# Patient Record
Sex: Male | Born: 1955 | Race: White | Hispanic: No | Marital: Married | Smoking: Former smoker
Health system: Southern US, Community
[De-identification: ages and names within clinical notes are randomized; demographics above are authoritative.]

## PROBLEM LIST (undated history)

## (undated) DIAGNOSIS — I10 Essential (primary) hypertension: Secondary | ICD-10-CM

## (undated) DIAGNOSIS — I803 Phlebitis and thrombophlebitis of lower extremities, unspecified: Secondary | ICD-10-CM

## (undated) DIAGNOSIS — K219 Gastro-esophageal reflux disease without esophagitis: Secondary | ICD-10-CM

## (undated) DIAGNOSIS — M1990 Unspecified osteoarthritis, unspecified site: Secondary | ICD-10-CM

## (undated) DIAGNOSIS — I82409 Acute embolism and thrombosis of unspecified deep veins of unspecified lower extremity: Secondary | ICD-10-CM

## (undated) HISTORY — DX: Gastro-esophageal reflux disease without esophagitis: K21.9

## (undated) HISTORY — PX: VARICOSE VEIN SURGERY: SHX832

## (undated) HISTORY — PX: OTHER SURGICAL HISTORY: SHX169

## (undated) HISTORY — DX: Phlebitis and thrombophlebitis of lower extremities, unspecified: I80.3

## (undated) HISTORY — DX: Unspecified osteoarthritis, unspecified site: M19.90

## (undated) HISTORY — PX: TONSILLECTOMY: SUR1361

## (undated) HISTORY — DX: Essential (primary) hypertension: I10

---

## 2011-06-29 ENCOUNTER — Encounter: Payer: Self-pay | Admitting: Thoracic Surgery (Cardiothoracic Vascular Surgery)

## 2011-06-29 DIAGNOSIS — I10 Essential (primary) hypertension: Secondary | ICD-10-CM

## 2011-06-29 DIAGNOSIS — K219 Gastro-esophageal reflux disease without esophagitis: Secondary | ICD-10-CM | POA: Insufficient documentation

## 2011-06-29 DIAGNOSIS — J329 Chronic sinusitis, unspecified: Secondary | ICD-10-CM | POA: Insufficient documentation

## 2011-06-29 DIAGNOSIS — M1990 Unspecified osteoarthritis, unspecified site: Secondary | ICD-10-CM | POA: Insufficient documentation

## 2011-06-29 DIAGNOSIS — I803 Phlebitis and thrombophlebitis of lower extremities, unspecified: Secondary | ICD-10-CM | POA: Insufficient documentation

## 2011-07-04 ENCOUNTER — Encounter: Payer: Self-pay | Admitting: Thoracic Surgery (Cardiothoracic Vascular Surgery)

## 2011-07-04 ENCOUNTER — Other Ambulatory Visit: Payer: Self-pay | Admitting: Thoracic Surgery (Cardiothoracic Vascular Surgery)

## 2011-07-04 ENCOUNTER — Institutional Professional Consult (permissible substitution) (INDEPENDENT_AMBULATORY_CARE_PROVIDER_SITE_OTHER): Payer: Managed Care, Other (non HMO) | Admitting: Thoracic Surgery (Cardiothoracic Vascular Surgery)

## 2011-07-04 VITALS — BP 140/92 | HR 75 | Resp 20 | Ht 70.0 in | Wt 210.0 lb

## 2011-07-04 DIAGNOSIS — J984 Other disorders of lung: Secondary | ICD-10-CM

## 2011-07-04 DIAGNOSIS — D381 Neoplasm of uncertain behavior of trachea, bronchus and lung: Secondary | ICD-10-CM

## 2011-07-04 DIAGNOSIS — R911 Solitary pulmonary nodule: Secondary | ICD-10-CM

## 2011-07-04 NOTE — Progress Notes (Signed)
PCP is CHUKWUEMEKA,DANA, DO, DO Referring Provider is Darrel Hoover, DO  Chief Complaint  Patient presents with  . Lung Mass    Referral from Dr Irena Reichmann for surgical eval on  lung mass,  Chest CT, 06/04/2011      HPI: Brett Holland is a 54 year old gentleman who recently presented with acute pain and swelling in his left leg he was found to have bilateral popliteal DVTs. A CT of the chest was done on November for evaluate for possible pulmonary embolus. He did fact have a small clot noted and a branch vessel on the right lung. He also was found to have a 9 mm nodule along the major fissure. A PET CT was then done which showed an SUV of the nodule 2.2, although resolution was difficult due to the central nature of the lesion. There were no other hypermetabolic areas noted on the scan. He states these have a chronic cough for years that is unchanged. Denies wheezing, shortness of breath, or hemoptysis, weight loss. He has gained some weight since he stopped smoking after he was found to have a lung nodule and says his appetite is increased since then. He also has been limiting his exercise is he was instructed after the PE was noted to  Past Medical History  Diagnosis Date  . Osteoarthritis   . HTN (hypertension)   . GERD (gastroesophageal reflux disease)   . Chronic sinusitis   . Phlebitis and thrombophlebitis of lower extremities     Past Surgical History  Procedure Date  . Left knee arthroscopy x2   . Varicose vein surgery   . Left shoulder capsulitis procedure     No family history on file. Strong family history of cardiac issues. Father died of MI related to blood clot (possible PE?)  Social History History  Substance Use Topics  . Smoking status: Former Smoker    Types: Cigarettes    Quit date: 06/20/2011  . Smokeless tobacco: Never Used  . Alcohol Use: Yes     only socially    Current Outpatient Prescriptions  Medication Sig Dispense Refill  . famotidine (PEPCID)  10 MG tablet Take 10 mg by mouth 2 (two) times daily.        Marland Kitchen loratadine (CLARITIN) 10 MG tablet Take 10 mg by mouth daily.        Marland Kitchen warfarin (COUMADIN) 7.5 MG tablet Take 7.5 mg by mouth daily.         Allergies  Allergen Reactions  . Codeine Other (See Comments)    gi    Review of Systems  Constitutional: Negative.   HENT: Negative.   Eyes: Negative.   Respiratory: Positive for cough (chronic, unchanged). Negative for chest tightness, shortness of breath, wheezing and stridor.        No hemoptysis  Cardiovascular: Positive for leg swelling. Negative for chest pain and palpitations.  Gastrointestinal: Negative.   Genitourinary: Negative.   Neurological: Negative.   Hematological: Bruises/bleeds easily (on coumadin).  Psychiatric/Behavioral: Negative.   All other systems reviewed and are negative.    BP 140/92  Pulse 75  Resp 20  Ht 5\' 10"  (1.778 m)  Wt 210 lb (95.255 kg)  BMI 30.13 kg/m2  SpO2 96% Physical Exam  Vitals reviewed. Constitutional: He is oriented to person, place, and time. He appears well-developed and well-nourished. No distress.  HENT:  Head: Normocephalic and atraumatic.  Eyes: EOM are normal. Pupils are equal, round, and reactive to light.  Neck: Normal range of  motion. No tracheal deviation present. No thyromegaly present.  Cardiovascular: Normal rate, regular rhythm, normal heart sounds and intact distal pulses.  Exam reveals no gallop and no friction rub.   No murmur heard.      2+ edema LE  Pulmonary/Chest: Effort normal and breath sounds normal.  Abdominal: Soft. There is no tenderness.  Musculoskeletal: Normal range of motion.  Lymphadenopathy:    He has no cervical adenopathy.  Neurological: He is alert and oriented to person, place, and time.       nonfocal  Skin: Skin is warm and dry.     Diagnostic Tests: CT and PET CT reviewed. 9 mm nodule along the major fissure centrally, appears to be in the midportion of the right lower lobe.  No mediastinal or hilar adenopathy. Mildly hypermetabolic by PET  Impression: Brett Holland is a 35 year old smoker with a newly discovered lung nodule that is mildly hypermetabolic by PET. The PET really is equivocal, given that this lesion is so small the start with, it being mildly hypermetabolic is not particularly helpful in the diagnostic algorithm.  I reviewed the films with Brett Holland and had a long discussion with them regarding the nodule, its differential diagnosis and our options for diagnosis and treatment. We basically have 2 options: 1. Radiographic followup-the obvious downside to this approach is that if the lesion is in fact a cancer there is potential for growth and/or spread in the interim. 2. Surgical biopsy-the problem with this approach is that this lesion is very central and not amenable to a minimally invasive approach to diagnosis. It would be difficult if not impossible to wedge this lesion and even if possible would require thoracotomy to approach it. Unfortunately the lesion is not particularly amenable to bronchoscopic biopsy, even with electromagnetic navigation, given its small size and central location and lack of an adjacent bronchus.  We discussed these issues at length, Brett Holland favors an aggressive approach, but does wish to wait until after Christmas before having a major surgical procedure. As the CT was done in early November and a week followup would be in early January. Therefore we will plan to see him back right after New Year's and repeat his CT scan. And then decide whether to proceed with an open surgical biopsy, which will likely be the plan  Plan:  Return to office on 08/02/2011 with repeat CT of chest (no contrast)

## 2011-08-02 ENCOUNTER — Inpatient Hospital Stay: Admission: RE | Admit: 2011-08-02 | Payer: Managed Care, Other (non HMO) | Source: Ambulatory Visit

## 2011-08-02 ENCOUNTER — Encounter: Payer: Managed Care, Other (non HMO) | Admitting: Thoracic Surgery (Cardiothoracic Vascular Surgery)

## 2011-08-03 ENCOUNTER — Encounter: Payer: Self-pay | Admitting: Thoracic Surgery (Cardiothoracic Vascular Surgery)

## 2011-08-03 ENCOUNTER — Ambulatory Visit (INDEPENDENT_AMBULATORY_CARE_PROVIDER_SITE_OTHER): Payer: Managed Care, Other (non HMO) | Admitting: Thoracic Surgery (Cardiothoracic Vascular Surgery)

## 2011-08-03 ENCOUNTER — Ambulatory Visit
Admission: RE | Admit: 2011-08-03 | Discharge: 2011-08-03 | Disposition: A | Discharge status: Home / Self Care | Payer: Managed Care, Other (non HMO) | Source: Ambulatory Visit | Attending: Thoracic Surgery (Cardiothoracic Vascular Surgery) | Admitting: Thoracic Surgery (Cardiothoracic Vascular Surgery)

## 2011-08-03 VITALS — BP 136/88 | HR 79 | Resp 16 | Ht 70.0 in | Wt 210.0 lb

## 2011-08-03 DIAGNOSIS — D381 Neoplasm of uncertain behavior of trachea, bronchus and lung: Secondary | ICD-10-CM

## 2011-08-03 DIAGNOSIS — R918 Other nonspecific abnormal finding of lung field: Secondary | ICD-10-CM

## 2011-08-03 NOTE — Progress Notes (Signed)
HPI 56 year old who was seen about a month ago for a right lung nodule. He had developed pain and swelling in his leg in early November and was found to have a DVT. He had a CT of the chest which showed a small pulmonary embolus and also several lung nodules. There is a calcified granuloma in the left lung that appeared benign. There was a 9 mm nodule which on CT was felt to be in the right upper lobe, but on PET was felt to be in the superior segment right lower lobe, in any event was on the major fissure. There also couple of small nodules in the left lung. A PET CT had been done which showed the right lung lesion to be mildly hypermetabolic with an SUV of 2.2. He did not want to have anything done prior to the holidays, so we scheduled a repeat CT and followup visit for today since his been about 2 months from his initial CT scan.  In the interim he says is felt well and has no complaints speak of. He remains on Coumadin and has not had any difficulties related to that  Current Outpatient Prescriptions  Medication Sig Dispense Refill  . amoxicillin (AMOXIL) 875 MG tablet Take 875 mg by mouth 2 (two) times daily.        . famotidine (PEPCID) 10 MG tablet Take 10 mg by mouth 2 (two) times daily.        Marland Kitchen loratadine (CLARITIN) 10 MG tablet Take 10 mg by mouth daily.        Marland Kitchen warfarin (COUMADIN) 7.5 MG tablet Take 7.5 mg by mouth daily.        Allergy Codeine  Review of Systems: See history of present illness, all other systems negative  Physical Exam  Vitals reviewed. Constitutional: He is oriented to person, place, and time. He appears well-developed and well-nourished. No distress.  HENT:  Head: Normocephalic and atraumatic.  Neck: No thyromegaly present.  Cardiovascular: Normal rate.        Slightly irregular  Pulmonary/Chest: Effort normal and breath sounds normal. He has no wheezes. He has no rales.  Lymphadenopathy:    He has no cervical adenopathy.  Neurological: He is alert and  oriented to person, place, and time.  Skin: Skin is warm and dry.  Psychiatric: He has a normal mood and affect.     Diagnostic Tests: CT of the chest was performed, apparently radiologist could not compared to the film from [place] as it is in the system under a separate medical record number, but I did compare the films. There has been no significant interval change, the right lung nodule was actually measured this time it 8 mm versus 9.5 previously, however this is within the marginal of error.  Impression: Mr. Vogelsang is a 56 year old gentleman with a history of tobacco use who has a central right lung nodule measuring about 8-9 mm in diameter, this is mildly hypermetabolic on PET. Unfortunately given the location of this nodule it would be very difficult to approach bronchoscopically even with the electromagnetic guidance. Certainly not approachable by percutaneous standpoint. I discussed with the patient and his wife the findings of the CTs, actually reviewed the films themselves with the patient and his wife  showing them the nodules and comparing between the 2 scans.  My recommendation to Mr. Shappell at this point given his been no change in 2 months and also taking into consideration the difficulty with accessing the nodule as well as  his recent DVT and PE was to repeat the scan in about 3 months. Given that this nodules not grown at all in 2 months is possible that this does not represent a lung cancer, and certainly if it was an aggressive cancer U. expectancy is size change in 2 months. His perioperative risk would be elevated given his recent DVT and PE think with additional time his risk would be reduced greatly for perioperative morbidity and mortality.  Plan: I'll plan to see Mr. Uy back in 3 months with a repeat CT of the chest to reevaluate the lung nodules and further discuss potential surgery versus continued radiologic observation

## 2011-10-03 ENCOUNTER — Other Ambulatory Visit: Payer: Self-pay | Admitting: Surgery

## 2011-10-03 DIAGNOSIS — D381 Neoplasm of uncertain behavior of trachea, bronchus and lung: Secondary | ICD-10-CM

## 2011-10-25 ENCOUNTER — Ambulatory Visit (INDEPENDENT_AMBULATORY_CARE_PROVIDER_SITE_OTHER): Payer: Managed Care, Other (non HMO) | Admitting: Thoracic Surgery (Cardiothoracic Vascular Surgery)

## 2011-10-25 ENCOUNTER — Ambulatory Visit
Admission: RE | Admit: 2011-10-25 | Discharge: 2011-10-25 | Disposition: A | Discharge status: Home / Self Care | Payer: Managed Care, Other (non HMO) | Source: Ambulatory Visit | Attending: Surgery | Admitting: Surgery

## 2011-10-25 ENCOUNTER — Encounter: Payer: Self-pay | Admitting: Thoracic Surgery (Cardiothoracic Vascular Surgery)

## 2011-10-25 VITALS — BP 146/96 | HR 85 | Resp 16 | Ht 70.0 in | Wt 213.0 lb

## 2011-10-25 DIAGNOSIS — D381 Neoplasm of uncertain behavior of trachea, bronchus and lung: Secondary | ICD-10-CM

## 2011-10-25 DIAGNOSIS — R911 Solitary pulmonary nodule: Secondary | ICD-10-CM

## 2011-10-25 DIAGNOSIS — Z09 Encounter for follow-up examination after completed treatment for conditions other than malignant neoplasm: Secondary | ICD-10-CM

## 2011-10-25 NOTE — Progress Notes (Signed)
HPI: Brett Holland 56 year old gentleman who we've been following since November with multiple lung nodules, the largest being an 8 mm nodule in the right lower lobe near the hilum. These were initially found that he had a CT of the chest, which diagnosed him with a pulmonary embolus He was last in the office in January. At that time the nodule had not changed in size. He now returns for 3 month followup. He says his been feeling well in the interim. He has not had any palpitations or difficulties with his Coumadin. He has no respiratory complaints.  Past Medical History  Diagnosis Date  . Osteoarthritis   . HTN (hypertension)   . GERD (gastroesophageal reflux disease)   . Chronic sinusitis   . Phlebitis and thrombophlebitis of lower extremities      Current Outpatient Prescriptions  Medication Sig Dispense Refill  . famotidine (PEPCID) 10 MG tablet Take 10 mg by mouth 2 (two) times daily.        Marland Kitchen loratadine (CLARITIN) 10 MG tablet Take 10 mg by mouth daily.        Marland Kitchen warfarin (COUMADIN) 7.5 MG tablet Take 7.5 mg by mouth daily.          Physical Exam: BP 146/96  Pulse 85  Resp 16  Ht 5\' 10"  (1.778 m)  Wt 213 lb (96.616 kg)  BMI 30.56 kg/m2  SpO2 97% General well-developed well-nourished in no acute distress Lungs clear with equal breath sounds bilaterally No palpable cervical or suprapubic or adenopathy  Diagnostic Tests: CT of the chest 10/25/11  IMPRESSION:  1. The majority of pulmonary nodules are similar. Tiny nodules  appear new and are favored to represent subpleural lymph nodes.  Recommend follow-up at 6 months to confirm ongoing stability. This  recommendation follows the consensus statement: Guidelines for  Management of Small Pulmonary Nodules Detected on CT Scans: A  Statement from the Fleischner Society as published in Radiology  2005; 237:395-400. 2. No acute process in the chest.  3. Vague hypoattenuation in the left lobe of the liver. Favored  to represent  branching vessel. Recommend attention on follow-up.  Impression: No change in pulmonary nodules in the 3 month interim since his last study. I reviewed the CT scan with Mr. and Mrs. Waitman. I recommended to him that we continue to follow these nodules. I recommended that we repeat a scan in 3 months.   Plan: Return to the office in 3 months with CT of the chest

## 2011-11-20 ENCOUNTER — Ambulatory Visit: Payer: Managed Care, Other (non HMO) | Admitting: Surgery

## 2011-11-20 ENCOUNTER — Other Ambulatory Visit: Payer: Managed Care, Other (non HMO)

## 2011-12-13 ENCOUNTER — Other Ambulatory Visit: Payer: Self-pay | Admitting: Thoracic Surgery (Cardiothoracic Vascular Surgery)

## 2011-12-13 DIAGNOSIS — D381 Neoplasm of uncertain behavior of trachea, bronchus and lung: Secondary | ICD-10-CM

## 2012-01-22 ENCOUNTER — Ambulatory Visit: Payer: Managed Care, Other (non HMO) | Admitting: Thoracic Surgery (Cardiothoracic Vascular Surgery)

## 2012-01-22 ENCOUNTER — Other Ambulatory Visit: Payer: Managed Care, Other (non HMO)

## 2012-01-29 ENCOUNTER — Ambulatory Visit: Payer: Managed Care, Other (non HMO) | Admitting: Thoracic Surgery (Cardiothoracic Vascular Surgery)

## 2012-01-29 ENCOUNTER — Other Ambulatory Visit: Payer: Managed Care, Other (non HMO)

## 2012-02-05 ENCOUNTER — Ambulatory Visit
Admission: RE | Admit: 2012-02-05 | Discharge: 2012-02-05 | Disposition: A | Discharge status: Home / Self Care | Payer: Managed Care, Other (non HMO) | Source: Ambulatory Visit | Attending: Thoracic Surgery (Cardiothoracic Vascular Surgery) | Admitting: Thoracic Surgery (Cardiothoracic Vascular Surgery)

## 2012-02-05 ENCOUNTER — Ambulatory Visit (INDEPENDENT_AMBULATORY_CARE_PROVIDER_SITE_OTHER): Payer: Managed Care, Other (non HMO) | Admitting: Thoracic Surgery (Cardiothoracic Vascular Surgery)

## 2012-02-05 ENCOUNTER — Encounter: Payer: Self-pay | Admitting: Thoracic Surgery (Cardiothoracic Vascular Surgery)

## 2012-02-05 VITALS — BP 137/93 | HR 89 | Resp 20 | Ht 70.0 in | Wt 213.0 lb

## 2012-02-05 DIAGNOSIS — D381 Neoplasm of uncertain behavior of trachea, bronchus and lung: Secondary | ICD-10-CM

## 2012-02-05 DIAGNOSIS — R918 Other nonspecific abnormal finding of lung field: Secondary | ICD-10-CM

## 2012-02-05 NOTE — Progress Notes (Signed)
HPI 56 yo returns for scheduled follow up for pulmonary nodules.  He was found to have multiple pulmonary nodules in January of this year on CT scan. I recommended observation and a repeat CT scan in 3 months there was no change in the nodules. He now returns for a 6 month followup. He says his been feeling well in the meantime with no issues.  Past Medical History  Diagnosis Date  . Osteoarthritis   . HTN (hypertension)   . GERD (gastroesophageal reflux disease)   . Chronic sinusitis   . Phlebitis and thrombophlebitis of lower extremities      Current Outpatient Prescriptions  Medication Sig Dispense Refill  . amoxicillin-clavulanate (AUGMENTIN) 875-125 MG per tablet Take 1 tablet by mouth 2 (two) times daily.       . famotidine (PEPCID) 10 MG tablet Take 10 mg by mouth 2 (two) times daily.        Marland Kitchen loratadine (CLARITIN) 10 MG tablet Take 10 mg by mouth daily.        Marland Kitchen warfarin (COUMADIN) 7.5 MG tablet Take 7.5 mg by mouth daily.          Physical Exam BP 137/93  Pulse 89  Resp 20  Ht 5\' 10"  (1.778 m)  Wt 213 lb (96.616 kg)  BMI 30.56 kg/m2  SpO2 95% Well-appearing 56 year old male in no acute distress  Diagnostic Tests:  CT chest shows no change in multiple pulmonary nodules   Impression: 56 year old white male with multiple pulmonary nodules. These have been stable on CT for 6 months. Continue to be followed every 3 months out to 2 years to assure stability.  Plan: Return in 3 months with CT of the chest

## 2012-04-11 ENCOUNTER — Other Ambulatory Visit: Payer: Self-pay | Admitting: Thoracic Surgery (Cardiothoracic Vascular Surgery)

## 2012-04-11 DIAGNOSIS — D381 Neoplasm of uncertain behavior of trachea, bronchus and lung: Secondary | ICD-10-CM

## 2012-05-13 ENCOUNTER — Encounter: Payer: Self-pay | Admitting: Thoracic Surgery (Cardiothoracic Vascular Surgery)

## 2012-05-13 ENCOUNTER — Ambulatory Visit
Admission: RE | Admit: 2012-05-13 | Discharge: 2012-05-13 | Disposition: A | Discharge status: Home / Self Care | Payer: Managed Care, Other (non HMO) | Source: Ambulatory Visit | Attending: Thoracic Surgery (Cardiothoracic Vascular Surgery) | Admitting: Thoracic Surgery (Cardiothoracic Vascular Surgery)

## 2012-05-13 ENCOUNTER — Ambulatory Visit (INDEPENDENT_AMBULATORY_CARE_PROVIDER_SITE_OTHER): Payer: Managed Care, Other (non HMO) | Admitting: Thoracic Surgery (Cardiothoracic Vascular Surgery)

## 2012-05-13 VITALS — BP 160/96 | HR 58 | Resp 18 | Ht 70.0 in | Wt 213.0 lb

## 2012-05-13 DIAGNOSIS — R918 Other nonspecific abnormal finding of lung field: Secondary | ICD-10-CM

## 2012-05-13 DIAGNOSIS — D381 Neoplasm of uncertain behavior of trachea, bronchus and lung: Secondary | ICD-10-CM

## 2012-05-13 NOTE — Progress Notes (Signed)
  HPI:  [name] returns today for a 3 month followup of multiple lung nodules. These nodules were first noted on CT angiogram in November of 2012 which was done to evaluate for pulmonary embolus. He did in fact have a pulmonary embolus. He also is noted to have several lung nodules the largest of which was in the right lung measured 9.5 mm. And elected to follow him at that time as he had just been diagnosed with acute PE. He has had CT scans every 3 months since then, which have shown no change in the size of the nodules.  Since his last visit he's been doing well with the exception of pain in his left knee from chronic osteoarthritis. He denies any chest pain or shortness of breath. His weight has been stable.   Past Medical History  Diagnosis Date  . Osteoarthritis   . HTN (hypertension)   . GERD (gastroesophageal reflux disease)   . Chronic sinusitis   . Phlebitis and thrombophlebitis of lower extremities   pulmonary embolus  Current Outpatient Prescriptions  Medication Sig Dispense Refill  . famotidine (PEPCID) 10 MG tablet Take 10 mg by mouth 2 (two) times daily.        Marland Kitchen loratadine (CLARITIN) 10 MG tablet Take 10 mg by mouth daily.        Marland Kitchen warfarin (COUMADIN) 7.5 MG tablet Take 7.5 mg by mouth daily.         Physical Exam BP 160/96  Pulse 58  Resp 18  Ht 5\' 10"  (1.778 m)  Wt 213 lb (96.616 kg)  BMI 30.56 kg/m2  SpO2 97% General well-developed well-nourished 56 year old male in no acute distress Neurologically alert and oriented x3 with no focal deficits Neck supple with no adenopathy Lungs clear with equal breath sounds bilaterally Cardiac regular rate and rhythm normal S1 and S2  Diagnostic Tests: CT of chest 05/13/2012 *RADIOLOGY REPORT*  Clinical Data: Follow-up lung nodule.  CT CHEST WITHOUT CONTRAST  Technique: Multidetector CT imaging of the chest was performed  following the standard protocol without IV contrast.  Comparison: 02/15/2012  Findings:  Perifissural nodule in the right lower lobe measures 8 mm  on image 28, stable. Calcified left upper lobe granuloma, stable.  5 mm nodule in the lingula on image 31, stable. The central left  upper lobe nodule on image 27 is stable. No new or enlarging  pulmonary nodules. No pleural effusions. Mild emphysematous  changes.  Heart is normal size. Aorta is normal caliber. No mediastinal,  hilar, or axillary adenopathy. Visualized thyroid and chest wall  soft tissues unremarkable. Imaging into the upper abdomen shows no  acute findings.  No acute bony abnormality.  IMPRESSION:  Stable small scattered pulmonary nodules. No new or enlarging  nodules. These nodules are stable dating back to November 2012 and  should be followed for stability over a 2-year period.  Impression: 56 year old male with multiple lung nodules. These have now been stable for a year. He needs to continue to be followed up to 2 years to ensure stability of the nodules. We'll plan to see him back in 4 months with a CT of the chest at that time.  Plan: Return in 4 months with CT of chest

## 2012-08-04 ENCOUNTER — Other Ambulatory Visit: Payer: Self-pay | Admitting: Thoracic Surgery (Cardiothoracic Vascular Surgery)

## 2012-08-04 DIAGNOSIS — D381 Neoplasm of uncertain behavior of trachea, bronchus and lung: Secondary | ICD-10-CM

## 2012-09-16 ENCOUNTER — Ambulatory Visit (INDEPENDENT_AMBULATORY_CARE_PROVIDER_SITE_OTHER): Payer: Managed Care, Other (non HMO) | Admitting: Thoracic Surgery (Cardiothoracic Vascular Surgery)

## 2012-09-16 ENCOUNTER — Ambulatory Visit
Admission: RE | Admit: 2012-09-16 | Discharge: 2012-09-16 | Disposition: A | Discharge status: Home / Self Care | Payer: Managed Care, Other (non HMO) | Source: Ambulatory Visit | Attending: Thoracic Surgery (Cardiothoracic Vascular Surgery) | Admitting: Thoracic Surgery (Cardiothoracic Vascular Surgery)

## 2012-09-16 ENCOUNTER — Encounter: Payer: Self-pay | Admitting: Thoracic Surgery (Cardiothoracic Vascular Surgery)

## 2012-09-16 VITALS — BP 137/91 | HR 67 | Resp 16 | Ht 70.0 in | Wt 200.0 lb

## 2012-09-16 DIAGNOSIS — D381 Neoplasm of uncertain behavior of trachea, bronchus and lung: Secondary | ICD-10-CM

## 2012-09-16 DIAGNOSIS — R918 Other nonspecific abnormal finding of lung field: Secondary | ICD-10-CM

## 2012-09-16 NOTE — Progress Notes (Signed)
HPI:  Brett Holland returns today for followup of a right lower lobe nodule. He is a 57 year old man who was being evaluated for a pulmonary embolus with a CT of the chest back in the fall of 2012. A small nodules noted in the right lower lobe. He also had some calcified granulomas. We elected to follow this nodule and it has remained stable over time. He was last in the office 4 months ago at which time the lesion was stable and he was doing well. He is a former smoker but he quit when this was found.  In the interim since his last visit Mr. Carstens says that he has been doing well. He's not had any cough or hemoptysis. His weight has been stable. He's not had any problems with shortness of breath or wheezing. He was taken off of Coumadin after one year.  Past Medical History  Diagnosis Date  . Osteoarthritis   . HTN (hypertension)   . GERD (gastroesophageal reflux disease)   . Chronic sinusitis   . Phlebitis and thrombophlebitis of lower extremities       Current Outpatient Prescriptions  Medication Sig Dispense Refill  . famotidine (PEPCID) 10 MG tablet Take 10 mg by mouth 2 (two) times daily.        Marland Kitchen loratadine (CLARITIN) 10 MG tablet Take 10 mg by mouth daily.         No current facility-administered medications for this visit.    Physical Exam BP 137/91  Pulse 67  Resp 16  Ht 5\' 10"  (1.778 m)  Wt 200 lb (90.719 kg)  BMI 28.7 kg/m2  SpO2 97% General 57 year old male in no acute distress No cervical or suprapubic or adenopathy Lungs clear with equal breath sounds bilaterally Cardiac regular rate and rhythm normal S1 and S2  Diagnostic Tests: CT chest 09/16/12 CT CHEST WITHOUT CONTRAST  Technique: Multidetector CT imaging of the chest was performed  following the standard protocol without IV contrast.  Comparison: CT chest of 05/13/2012 ,08/03/2011 and 06/04/2011  Findings: The nodule near the fissure within the right lower lobe  is stable measuring 8 mm in diameter,  unchanged compared to the CT  from 05/2011. In addition small calcified granulomas within the  left upper lobe are stable as is the vague nodular density within  the lingula. No new or enlarging nodule is seen. The stability of  these nodules over almost 2 years is indicative of a benign  process. No additional follow-up CT chest is necessary in view of  the stability. No infiltrate or effusion is seen. The central  airway is patent. There are degenerative changes in the mid to  lower thoracic spine.  On soft tissue window images, the thyroid gland is stable. On this  unenhanced study, no mediastinal or hilar adenopathy is seen.  Calcified left hilar nodes are present from prior granulomatous  disease. No abnormality within the upper abdomen is seen.  IMPRESSION:  1. Calcified granulomas and calcified left hilar nodes are stable  consistent with prior granulomatous disease.  2. The 8 mm non-calcified perifissural node in the right lower  lobe is stable. No enlarging nodule is seen.   Impression: 57 year old gentleman with a right lower lobe nodule that is unchanged for a little over a year now. This needs to be followed out to 2 years, which will be next fall. We'll plan to continue to follow him at four-month intervals.   Plan: Return in 4 months with CT of chest  .

## 2012-12-19 ENCOUNTER — Other Ambulatory Visit: Payer: Self-pay | Admitting: *Deleted

## 2012-12-19 DIAGNOSIS — R911 Solitary pulmonary nodule: Secondary | ICD-10-CM

## 2013-01-05 ENCOUNTER — Other Ambulatory Visit: Payer: Self-pay | Admitting: *Deleted

## 2013-01-06 ENCOUNTER — Encounter: Payer: Self-pay | Admitting: Thoracic Surgery (Cardiothoracic Vascular Surgery)

## 2013-01-06 ENCOUNTER — Ambulatory Visit
Admission: RE | Admit: 2013-01-06 | Discharge: 2013-01-06 | Disposition: A | Discharge status: Home / Self Care | Payer: Managed Care, Other (non HMO) | Source: Ambulatory Visit | Attending: Thoracic Surgery (Cardiothoracic Vascular Surgery) | Admitting: Thoracic Surgery (Cardiothoracic Vascular Surgery)

## 2013-01-06 ENCOUNTER — Ambulatory Visit (INDEPENDENT_AMBULATORY_CARE_PROVIDER_SITE_OTHER): Payer: Managed Care, Other (non HMO) | Admitting: Thoracic Surgery (Cardiothoracic Vascular Surgery)

## 2013-01-06 VITALS — BP 125/80 | HR 80 | Resp 19 | Ht 70.0 in | Wt 195.0 lb

## 2013-01-06 DIAGNOSIS — R911 Solitary pulmonary nodule: Secondary | ICD-10-CM

## 2013-01-06 NOTE — Progress Notes (Signed)
  HPI:  Brett Holland returns today to schedule followup visit. He is a 57 year old gentleman who I been following since December of 2012 for multiple lung nodules. In October 2012 he had had a scan of his legs which showed bilateral popliteal DVTs. A CT of the chest was done to rule out pulmonary embolus. There was no PE but he was noted to have multiple bilateral lung nodules. He did have some calcified granulomas. A PET CT showed the lesion in the right lower lobe to be mildly hypermetabolic. Given his recent DVT and risk of surgery we elected to follow it at that time. We have been following him with regular CTs ever since then and nodules have remained unchanged. He was last seen in February which time he was doing well.  He says that in the interim since his last visit he's continued to do well. He is exercising regularly and has lost some weight. He quit smoking in October of 2012 and he was first diagnosed with blood clots. He has not smoked since then. Overall he feels well.  Past Medical History  Diagnosis Date  . Osteoarthritis   . HTN (hypertension)   . GERD (gastroesophageal reflux disease)   . Chronic sinusitis   . Phlebitis and thrombophlebitis of lower extremities       Current Outpatient Prescriptions  Medication Sig Dispense Refill  . famotidine (PEPCID) 10 MG tablet Take 10 mg by mouth 2 (two) times daily.        . fluticasone (FLONASE) 50 MCG/ACT nasal spray       . loratadine (CLARITIN) 10 MG tablet Take 10 mg by mouth daily.         No current facility-administered medications for this visit.    Physical Exam BP 125/80  Pulse 80  Resp 19  Ht 5\' 10"  (1.778 m)  Wt 195 lb (88.451 kg)  BMI 27.98 kg/m2  SpO2 98% Well-appearing 58 year old male in no acute distress Alert and oriented x3 Cardiac regular rate and rhythm normal S1 and S2 No cervical or subclavicular adenopathy Lungs clear with equal breath sounds bilaterally  Diagnostic Tests: CT of chest  01/06/2013 *RADIOLOGY REPORT*  Clinical Data: 70-month follow up of lung nodule. Ex-smoker.  CT CHEST WITHOUT CONTRAST  Technique: Multidetector CT imaging of the chest was performed  following the standard protocol without IV contrast.  Comparison: 09/16/2012 and 05/13/2012  Findings: Lungs/pleura: Mild centrilobular emphysema.  Similar perifissural right lower lobe lung nodule which measures 8  mm on image 27/series 4.  Calcified left upper lobe granuloma.  Left upper lobe 4 mm subpleural nodule on image 29/series 4 is  unchanged. No pleural fluid.  Heart/Mediastinum: Tortuous thoracic aorta. Borderline  cardiomegaly, without pericardial effusion. Calcified left hilar  lymph node, consistent with old granulomatous disease.  Upper abdomen: Mild right adrenal thickening which is unchanged.  Bones/Musculoskeletal: No acute osseous abnormality. Prominent  end plate osteophytes, including at T10-T11.  IMPRESSION:  1. Similar appearance of 8 mm right lower lobe lung nodule. No  new or enlarging nodules identified.  2. No acute process in the chest.  Original Report Authenticated By: Jeronimo Greaves, M.D.   Impression: 57 year old former smoker with multiple lung nodules first noted in October 2012. These have remained unchanged on multiple CT scans. He needs another CT in 4 months to complete his 2 year followup.   Plan: Return in 4 months with CT of chest

## 2013-02-26 ENCOUNTER — Encounter: Payer: Self-pay | Admitting: Cardiovascular Disease

## 2013-02-26 ENCOUNTER — Ambulatory Visit (INDEPENDENT_AMBULATORY_CARE_PROVIDER_SITE_OTHER): Payer: Managed Care, Other (non HMO) | Admitting: Cardiovascular Disease

## 2013-02-26 VITALS — BP 115/80 | HR 65 | Wt 187.0 lb

## 2013-02-26 DIAGNOSIS — R9439 Abnormal result of other cardiovascular function study: Secondary | ICD-10-CM

## 2013-02-26 DIAGNOSIS — I803 Phlebitis and thrombophlebitis of lower extremities, unspecified: Secondary | ICD-10-CM

## 2013-02-26 DIAGNOSIS — I10 Essential (primary) hypertension: Secondary | ICD-10-CM

## 2013-02-26 DIAGNOSIS — R079 Chest pain, unspecified: Secondary | ICD-10-CM

## 2013-02-26 NOTE — Assessment & Plan Note (Signed)
F/U Dr Jimmie Molly Continue to wear support hose

## 2013-02-26 NOTE — Progress Notes (Signed)
Patient ID: Brett Holland, male   DOB: Jul 16, 1956, 57 y.o.   MRN: [id number] 57 yo referred from [facility].  Episode of chest pain at work about 3 weeks ago.  Mild diaphoresis.  Had f/u myovue.  I reviewed the report and actual images which patient brought on disc.  Suboptimal study with "hot spot" on anterior wall.  Study looked normal to me especially on gray scale imaging  Stress test portion also normal with HTN response Read as anterior ischemia.  Patient has not had recurrent pain. Very concerned about his heart due to family history of premature CAD.  Also has family history of hypercoagulable state and he was on coumadin at one time but testing showed no issues and he is not anticoagulated at this time Has GERD on Rx but this has not caused chest pain.  Chronic LE vein disease sees Dr Jimmie Molly for   ROS: Denies fever, malais, weight loss, blurry vision, decreased visual acuity, cough, sputum, SOB, hemoptysis, pleuritic pain, palpitaitons, heartburn, abdominal pain, melena, lower extremity edema, claudication, or rash.  All other systems reviewed and negative   General: Affect appropriate Healthy:  appears stated age HEENT: normal Neck supple with no adenopathy JVP normal no bruits no thyromegaly Lungs clear with no wheezing and good diaphragmatic motion Heart:  S1/S2 no murmur,rub, gallop or click PMI normal Abdomen: benighn, BS positve, no tenderness, no AAA no bruit.  No HSM or HJR Distal pulses intact with no bruits Trace bilateral  Edema  With varicosities Neuro non-focal Skin warm and dry No muscular weakness  Medications Current Outpatient Prescriptions  Medication Sig Dispense Refill  . famotidine (PEPCID) 10 MG tablet Take 10 mg by mouth 2 (two) times daily.        . fluticasone (FLONASE) 50 MCG/ACT nasal spray Place 1 spray into the nose daily.       Marland Kitchen loratadine (CLARITIN) 10 MG tablet Take 10 mg by mouth daily.         No current facility-administered medications  for this visit.    Allergies Codeine  Family History: No family history on file.  Social History: History   Social History  . Marital Status: Married    Spouse Name: N/A    Number of Children: N/A  . Years of Education: N/A   Occupational History  . Not on file.   Social History Main Topics  . Smoking status: Former Smoker    Types: Cigarettes    Quit date: 06/20/2011  . Smokeless tobacco: Never Used  . Alcohol Use: Yes     Comment: only socially  . Drug Use: No  . Sexually Active: Not on file   Other Topics Concern  . Not on file   Social History Narrative  . No narrative on file    Electrocardiogram:  NSR rate 65 low voltage otherwise normal  Assessment and Plan

## 2013-02-26 NOTE — Assessment & Plan Note (Signed)
Single sever episode with f/u myovue read as ? Anterior ischemia.  Review to me looks like normal/low risk scan.  Reviewed his CT scan from April and he has no coronary calcium  Gave him the option of doing nothing and waiting for more symptoms or having cardiac CT.  He needs to know if he has any CAD and is aggravated about positive myovue.  Discussed cardiac CT and he is willing to have it.  Samples of bystolic given 75m to take night before and morning of.  Anatomic study would resolve issue of abnormal physiologic test and detect any disease that would need risk factor modification in lieu of family history and not just detect severe flow limiting disease

## 2013-02-26 NOTE — Patient Instructions (Addendum)
Your physician recommends that you schedule a follow-up appointment in: PENDING CT RESULTS Your physician recommends that you continue on your current medications as directed. Please refer to the Current Medication list given to you today.  Your physician has requested that you have cardiac CT. Cardiac computed tomography (CT) is a painless test that uses an x-ray machine to take clear, detailed pictures of your heart. For further information please visit [email or web address] Please follow instruction sheet as given.  TAKE SAMPLE   MED   NIGHT BEFORE AND AM  OF  TEST

## 2013-03-03 ENCOUNTER — Encounter (HOSPITAL_COMMUNITY): Payer: Self-pay

## 2013-03-03 ENCOUNTER — Encounter: Payer: Self-pay | Admitting: Cardiology

## 2013-03-03 ENCOUNTER — Ambulatory Visit (HOSPITAL_COMMUNITY)
Admission: RE | Admit: 2013-03-03 | Discharge: 2013-03-03 | Disposition: A | Discharge status: Home / Self Care | Payer: Managed Care, Other (non HMO) | Source: Ambulatory Visit | Attending: Cardiovascular Disease | Admitting: Cardiovascular Disease

## 2013-03-03 DIAGNOSIS — R9439 Abnormal result of other cardiovascular function study: Secondary | ICD-10-CM

## 2013-03-03 DIAGNOSIS — R079 Chest pain, unspecified: Secondary | ICD-10-CM | POA: Insufficient documentation

## 2013-03-03 MED ORDER — NITROGLYCERIN 0.4 MG SL SUBL: 0.4000 mg | SUBLINGUAL_TABLET | SUBLINGUAL | Status: DC | PRN

## 2013-03-03 MED ORDER — IOHEXOL 350 MG/ML SOLN
100.0000 mL | Freq: Once | INTRAVENOUS | Status: AC | PRN
  Administered 2013-03-03: 100 mL via INTRAVENOUS

## 2013-03-03 MED ORDER — NITROGLYCERIN 0.4 MG SL SUBL
SUBLINGUAL_TABLET | SUBLINGUAL | Status: AC
  Administered 2013-03-03: 0.4 mg
  Filled 2013-03-03: qty 25

## 2013-03-03 MED ORDER — METOPROLOL TARTRATE 1 MG/ML IV SOLN
INTRAVENOUS | Status: AC
  Filled 2013-03-03: qty 5

## 2013-04-29 ENCOUNTER — Other Ambulatory Visit: Payer: Self-pay

## 2013-04-29 DIAGNOSIS — D381 Neoplasm of uncertain behavior of trachea, bronchus and lung: Secondary | ICD-10-CM

## 2013-05-26 ENCOUNTER — Ambulatory Visit
Admission: RE | Admit: 2013-05-26 | Discharge: 2013-05-26 | Disposition: A | Discharge status: Home / Self Care | Payer: Managed Care, Other (non HMO) | Source: Ambulatory Visit | Attending: Thoracic Surgery (Cardiothoracic Vascular Surgery) | Admitting: Thoracic Surgery (Cardiothoracic Vascular Surgery)

## 2013-05-26 ENCOUNTER — Ambulatory Visit (INDEPENDENT_AMBULATORY_CARE_PROVIDER_SITE_OTHER): Payer: Managed Care, Other (non HMO) | Admitting: Thoracic Surgery (Cardiothoracic Vascular Surgery)

## 2013-05-26 VITALS — BP 155/94 | HR 73 | Resp 16 | Ht 70.0 in | Wt 185.0 lb

## 2013-05-26 DIAGNOSIS — R918 Other nonspecific abnormal finding of lung field: Secondary | ICD-10-CM

## 2013-05-26 DIAGNOSIS — D381 Neoplasm of uncertain behavior of trachea, bronchus and lung: Secondary | ICD-10-CM

## 2013-05-26 NOTE — Progress Notes (Signed)
HPI:  Mr. Fearnow returns today for a 4 month followup visit. He is a 57 year old gentleman who had a pulmonary embolus back in 2012. That was diagnosed on the CT angiogram chest. He also was found to have an 8 mm right lung nodule. Given that he had a recent PE, we made the decision not to operate but to follow this radiographically.  Since his last visit he lost about 40 pounds. He attributes this primarily to diet and eliminating carbohydrates. He's been feeling well. He denies cough, hemoptysis. He did have an episode back in July where he had some chest pain. He was worked up and found not to have any coronary disease. He did quit smoking back in 2012 and he was diagnosed with pulmonary emboli.  Past Medical History  Diagnosis Date  . Osteoarthritis   . HTN (hypertension)   . GERD (gastroesophageal reflux disease)   . Chronic sinusitis   . Phlebitis and thrombophlebitis of lower extremities       Current Outpatient Prescriptions  Medication Sig Dispense Refill  . famotidine (PEPCID) 10 MG tablet Take 10 mg by mouth 2 (two) times daily.        . fluticasone (FLONASE) 50 MCG/ACT nasal spray Place 1 spray into the nose daily.       Marland Kitchen loratadine (CLARITIN) 10 MG tablet Take 10 mg by mouth daily.        Marland Kitchen amoxicillin-clavulanate (AUGMENTIN) 875-125 MG per tablet        No current facility-administered medications for this visit.    Physical Exam BP 155/94  Pulse 73  Resp 16  Ht 5\' 10"  (1.778 m)  Wt 185 lb (83.915 kg)  BMI 26.54 kg/m2  SpO70 34% 57 year old male in no acute distress General well-developed well-nourished Neurologic alert and oriented x3 with no focal deficits Cardiac regular in rhythm normal S1 and S2 Lungs clear with equal breath sounds bilaterally  Diagnostic Tests: CT of chest 05/26/2013 CLINICAL DATA: Right lower lobe pulmonary nodule diagnosed 2 years  ago. Prior history of smoking. Followup study.  EXAM:  CT CHEST WITHOUT CONTRAST  TECHNIQUE:   Multidetector CT imaging of the chest was performed following the  standard protocol without IV contrast.  COMPARISON: Chest CT 01/06/2013.  FINDINGS:  Mediastinum: Heart size is normal. There is no significant  pericardial fluid, thickening or pericardial calcification. No  pathologically enlarged mediastinal or hilar lymph nodes. Esophagus  is unremarkable in appearance. Calcified left hilar lymph nodes.  Lungs/Pleura: Again noted is an 8 mm nodule in the perihilar aspect  of the right upper lobe abutting the major fissure (image 34 of  series 4), which appears unchanged in retrospect compared to  original examination from 06/04/2011. Calcified granuloma in the  anterior aspect of the left upper lobe is unchanged. No other  suspicious appearing pulmonary nodules or masses are otherwise  identified. No acute consolidative airspace disease. No pleural  effusions. Small amount of subsegmental atelectasis or scarring in  the medial segment of the right middle lobe and posterior aspect of  the left lower lobe. Small bulla in the perihilar severe of the left  upper lobe. Mild paraseptal emphysema, most pronounced in the lung  apices.  Upper Abdomen: Unremarkable.  Musculoskeletal: There are no aggressive appearing lytic or blastic  lesions noted in the visualized portions of the skeleton.  IMPRESSION:  1. No significant change in an 8 mm right upper lobe nodule, as  discussed above, when compared to remote prior study from  06/04/2011. At this point, 22-and-a-half months of stability has  been documented, virtually ensuring a benign etiology. If there is a  desire for further followup to document a full 2 years of stability,  an additional CT scan could be performed in 1 year, however, this is  unlikely to be necessary.  2. Evidence of old granulomatous disease, as above.  3. Mild paraseptal emphysema.  Electronically Signed  By: Trudie Reed M.D.  On: 05/26/2013  13:41  Impression: 57 year old gentleman with an 8 mm right lung nodule. He also has evidence of old granulomatous disease. This lesion is been stable for 2 years and is almost certainly benign. Even though it is benign there is potential for growth and it is in a fairly central location. I recommended that we repeat a CT in one year to reevaluate the nodule.  Plan: Return in 12 months with CT of chest

## 2013-07-30 HISTORY — PX: OTHER SURGICAL HISTORY: SHX169

## 2014-04-22 ENCOUNTER — Other Ambulatory Visit: Payer: Self-pay | Admitting: *Deleted

## 2014-04-22 DIAGNOSIS — R911 Solitary pulmonary nodule: Secondary | ICD-10-CM

## 2014-05-18 ENCOUNTER — Ambulatory Visit
Admission: RE | Admit: 2014-05-18 | Discharge: 2014-05-18 | Disposition: A | Discharge status: Home / Self Care | Payer: Managed Care, Other (non HMO) | Source: Ambulatory Visit | Attending: Thoracic Surgery (Cardiothoracic Vascular Surgery) | Admitting: Thoracic Surgery (Cardiothoracic Vascular Surgery)

## 2014-05-18 ENCOUNTER — Encounter: Payer: Self-pay | Admitting: Thoracic Surgery (Cardiothoracic Vascular Surgery)

## 2014-05-18 ENCOUNTER — Ambulatory Visit (INDEPENDENT_AMBULATORY_CARE_PROVIDER_SITE_OTHER): Payer: Managed Care, Other (non HMO) | Admitting: Thoracic Surgery (Cardiothoracic Vascular Surgery)

## 2014-05-18 VITALS — BP 135/91 | HR 75 | Ht 70.0 in | Wt 185.0 lb

## 2014-05-18 DIAGNOSIS — R911 Solitary pulmonary nodule: Secondary | ICD-10-CM

## 2014-05-18 DIAGNOSIS — R918 Other nonspecific abnormal finding of lung field: Secondary | ICD-10-CM

## 2014-05-18 NOTE — Progress Notes (Signed)
HPI:  Brett Holland returns today for a scheduled one year followup visit.  He is a 58 year old gentleman who has a right lower lobe nodule located along the major fissure centrally. This was first discovered in 2012 and was 8 mm at the time. He had has been diagnosed with a DVT at the time and we've elected to follow the nodule. It has remained stable with no change in size through his visit a year ago.  He says that in the past year he's been feeling well except for pain in his left knee. He will likely have to have that replaced same. He has not had any chest pain, shortness of breath, wheezing, unusual cough or hemoptysis. His weight has been stable  Past Medical History  Diagnosis Date  . Osteoarthritis   . HTN (hypertension)   . GERD (gastroesophageal reflux disease)   . Chronic sinusitis   . Phlebitis and thrombophlebitis of lower extremities        Current Outpatient Prescriptions  Medication Sig Dispense Refill  . famotidine (PEPCID) 10 MG tablet Take 10 mg by mouth 2 (two) times daily.        . fluticasone (FLONASE) 50 MCG/ACT nasal spray Place 1 spray into the nose daily.       Marland Kitchen loratadine (CLARITIN) 10 MG tablet Take 10 mg by mouth daily.        . polyethylene glycol-electrolytes (NULYTELY/GOLYTELY) 420 G solution        No current facility-administered medications for this visit.    Physical Exam BP 135/91  Pulse 75  Ht 5\' 10"  (1.778 m)  Wt 185 lb (83.915 kg)  BMI 26.54 kg/m2  SpO67 48% 58 year old man in no acute distress Well-developed and well-nourished Cardiac regular rate and rhythm normal S1 and S2 Lungs clear with equal sounds bilaterally, no wheezing.  Diagnostic Tests: CT CHEST WITHOUT CONTRAST  TECHNIQUE:  Multidetector CT imaging of the chest was performed following the  standard protocol without IV contrast.  COMPARISON: Multiple priors, most recently 10/21/2013.  FINDINGS:  Mediastinum: Heart size is normal. There is no significant   pericardial fluid, thickening or pericardial calcification. No  pathologically enlarged mediastinal or hilar lymph nodes. Please  note that accurate exclusion of hilar adenopathy is limited on  noncontrast CT scans. Some calcified left hilar lymph nodes are  noted. Esophagus is unremarkable in appearance.  Lungs/Pleura: Previously noted nodule in the central aspect of the  lower right upper lobe appears slightly larger than prior  examinations, measuring 8 x 12 x 9 mm on today's study (images 32 of  series 4, 75 of series 601 (coronal), and 57 of series 602)  sagittal)). This lesion has low-attenuation within it (-51 HU),  suggestive of significant fatty components. No calcification  associated with this lesion. There are 2 calcified pulmonary nodules  in the left upper lobe, compatible with calcified granulomas. No  other new suspicious appearing pulmonary nodules or masses are  otherwise noted. No acute consolidative airspace disease. No pleural  effusions. Mild paraseptal emphysema, most evident in the lung  apices.  Upper Abdomen: Unremarkable.  Musculoskeletal: There are no aggressive appearing lytic or blastic  lesions noted in the visualized portions of the skeleton.  IMPRESSION:  1. Slight interval growth of previously described macrolobulated  nodule in the central aspect of the inferior right upper lobe, which  currently measures 8 x 12 x 9 mm. Given the slow progressive growth,  and the internal fatty attenuation of this lesion, this  is favored  to be a benign lesion such as a slowly growing hamartoma. However,  the growth observed on today's examination is more significant than  on any other prior examinations. Accordingly, another repeat  evaluation with Chest CT in 6-12 months is recommended.  2. Small calcified granulomas in the left upper lobe with calcified  left hilar lymph node related to old granulomatous disease.  3. Mild paraseptal emphysema.  Electronically  Signed  By: Vinnie Langton M.D.  On: 05/18/2014 13:48   Impression: 59 year old gentleman with a right lower lobe nodule. This is now been followed for 3 years. In the interval since his last CT scan a year ago this has increased in size and now measures 8 x 9 x 12 mm. 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  I discussed the options of continued radiographic followup versus surgical resection with [name]. Given the benign appearance of this lesion I do not fill there is a compelling indication for surgery. Given that it has increased in size I do think that continued radiographic followup is necessary.  After discussing the options we will continue to follow this radiographically  Plan:  I will plan to see him back in 1 year with a CT of the chest

## 2015-05-09 ENCOUNTER — Other Ambulatory Visit: Payer: Self-pay | Admitting: *Deleted

## 2015-05-09 DIAGNOSIS — R911 Solitary pulmonary nodule: Secondary | ICD-10-CM

## 2015-05-11 ENCOUNTER — Other Ambulatory Visit: Payer: Self-pay | Admitting: *Deleted

## 2015-05-11 DIAGNOSIS — R911 Solitary pulmonary nodule: Secondary | ICD-10-CM

## 2015-05-17 ENCOUNTER — Ambulatory Visit: Payer: Managed Care, Other (non HMO) | Admitting: Thoracic Surgery (Cardiothoracic Vascular Surgery)

## 2015-05-17 ENCOUNTER — Encounter: Payer: Self-pay | Admitting: Thoracic Surgery (Cardiothoracic Vascular Surgery)

## 2015-05-17 ENCOUNTER — Ambulatory Visit (INDEPENDENT_AMBULATORY_CARE_PROVIDER_SITE_OTHER): Payer: Managed Care, Other (non HMO) | Admitting: Thoracic Surgery (Cardiothoracic Vascular Surgery)

## 2015-05-17 ENCOUNTER — Ambulatory Visit
Admission: RE | Admit: 2015-05-17 | Discharge: 2015-05-17 | Disposition: A | Discharge status: Home / Self Care | Payer: Managed Care, Other (non HMO) | Source: Ambulatory Visit | Attending: Thoracic Surgery (Cardiothoracic Vascular Surgery) | Admitting: Thoracic Surgery (Cardiothoracic Vascular Surgery)

## 2015-05-17 VITALS — BP 145/90 | HR 78 | Resp 20 | Ht 70.0 in | Wt 185.0 lb

## 2015-05-17 DIAGNOSIS — R911 Solitary pulmonary nodule: Secondary | ICD-10-CM

## 2015-05-17 NOTE — Progress Notes (Signed)
[address]       [place] [address]             [phone]       HPI: Mr. Huot returns for a 1 year follow-up.  He is a 59 year old man who had a DVT back in 2012. A CT of the chest showed a right lower lobe nodule in close proximity to the hilum. Given his recent DVT at that time we elected to follow the nodule rather than operate. We have followed him with scans since then. Initially, the nodule change very little. At his last visit a year ago the nodule had grown a fair amount. 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 We discussed resection versus continued follow-up and decided to get another scan a year.  He's been doing well. He has not had any significant health issues over the past year. He does exercise on a regular basis. He has not had any significant cough. He denies wheezing or flushing.  Past Medical History  Diagnosis Date  . Osteoarthritis   . HTN (hypertension)   . GERD (gastroesophageal reflux disease)   . Chronic sinusitis   . Phlebitis and thrombophlebitis of lower extremities    Past Surgical History  Procedure Laterality Date  . Left knee arthroscopy x2    . Varicose vein surgery    . Left shoulder capsulitis procedure        Current Outpatient Prescriptions  Medication Sig Dispense Refill  . famotidine (PEPCID) 10 MG tablet Take 10 mg by mouth 2 (two) times daily.      . fluticasone (FLONASE) 50 MCG/ACT nasal spray Place 1 spray into the nose daily.     Marland Kitchen loratadine (CLARITIN) 10 MG tablet Take 10 mg by mouth daily.       No current facility-administered medications for this visit.    Physical Exam BP 145/90 mmHg  Pulse 78  Resp 20  Ht '5\' 10"'$  (1.778 m)  Wt 185 lb (83.915 kg)  BMI 26.54 kg/m2  SpO79 50% 59 year old man in no acute distress Alert and oriented 3 with no focal deficits No cervical or subclavicular adenopathy Cardiac regular rate and rhythm normal S1 and  S2, no murmur Lungs clear with equal breath sounds bilaterally  Diagnostic Tests: CT CHEST WITHOUT CONTRAST  TECHNIQUE: Multidetector CT imaging of the chest was performed following the standard protocol without IV contrast.  COMPARISON: 05/18/2014  FINDINGS: Mediastinum: The heart size appears normal. No pericardial effusion. Aortic atherosclerosis noted. Calcified left hilar lymph node identified. No mediastinal or hilar adenopathy.  Lungs/Pleura: The right lower lobe perihilar lung nodule measures 1.3 x 1.1 cm, image 31/ series 4. On the most recent previous exam this measured 1.2 x 0.8 cm. On study from 08/03/2011 this nodule measured 0.8 x 0.7 cm. Calcified granuloma is again noted in the left upper lobe. No new suspicious nodules identified.  Upper Abdomen: No suspicious liver abnormality. The gallbladder appears normal. No biliary dilatation. Unremarkable appearance of the spleen. Stable low-attenuation nodule in the right adrenal gland which measures 11 mm. Likely adenoma. The left adrenal gland appears normal.  Musculoskeletal: Mild spondylosis noted within the thoracic spine. No aggressive lytic or sclerotic bone lesions.  IMPRESSION: 1. There has been continued increase in size of right upper lobe perihilar lung nodule. Findings are suspicious for a slow growing, indolent neoplasm. Recommend further evaluation with PET-CT and/or tissue sampling.  Electronically Signed  By: Kerby Moors M.D.  On: 05/17/2015 09:39  I personally reviewed the CT scan and concur with the findings as noted above  Impression: 59 year old man with a known right lung nodule that has slowly increased in size over the past several years. This has gone from 9 mm now up to 1.3 cm. There is noticeable growth over the past year.  I still believe this is a benign neoplasm. 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 It could possibly be a low-grade carcinoid tumor.   I had a long  discussion with Mr. and Mrs. Effinger today. We reviewed the films. We talked about our options of continued radiographic follow-up versus surgical resection. While I still think this is likely a benign tumor, it has grown slowly but surely over the past couple of years. I think it would be reasonable to take the nodule out at this point, but it is not absolutely mandatory.  I did discuss with them the proposed operation. We could do this thoracoscopically. It appears to be a hamartoma we could just shell it out without any major resection. If it turned out to be carcinoid he would end up needing a lobectomy due to the location centrally.  After discussing the issues he would like to wait until next year. We did agree to do a follow-up in 6 months and we'll again discuss the possibility of surgical resection at that time.  Plan:  Return in 6 months with CT chest  I spent 10 minutes with Mr. Blanck during this visit, greater than 50% was in counseling and answering questions. Melrose Nakayama, MD [facility] [phone]

## 2015-05-31 ENCOUNTER — Ambulatory Visit: Payer: Managed Care, Other (non HMO) | Admitting: Thoracic Surgery (Cardiothoracic Vascular Surgery)

## 2015-06-28 ENCOUNTER — Encounter: Payer: Self-pay | Admitting: Thoracic Surgery (Cardiothoracic Vascular Surgery)

## 2015-06-28 ENCOUNTER — Ambulatory Visit (INDEPENDENT_AMBULATORY_CARE_PROVIDER_SITE_OTHER): Payer: Managed Care, Other (non HMO) | Admitting: Thoracic Surgery (Cardiothoracic Vascular Surgery)

## 2015-06-28 ENCOUNTER — Other Ambulatory Visit: Payer: Self-pay | Admitting: *Deleted

## 2015-06-28 VITALS — BP 155/93 | HR 68 | Resp 16 | Ht 70.0 in | Wt 198.0 lb

## 2015-06-28 DIAGNOSIS — R911 Solitary pulmonary nodule: Secondary | ICD-10-CM | POA: Diagnosis not present

## 2015-06-28 DIAGNOSIS — R918 Other nonspecific abnormal finding of lung field: Secondary | ICD-10-CM

## 2015-06-28 NOTE — Progress Notes (Signed)
[address]       ,[place] [address]             [phone]       HPI: [name] returns to discuss surgery for his right lower lobe nodule  He is a 59 year old man who had a DVT back in 2012. A CT of the chest showed a right lower lobe nodule in close proximity to the hilum. Given his recent DVT at that time we elected to follow the nodule rather than operate. We have followed him with scans since then. Initially, the nodule change very little. At his visit a year ago the nodule had grown a fair amount. 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 We discussed resection versus continued follow-up and decided to get another scan a year.  He's been doing well. He has not had any significant health issues over the past year. He does exercise on a regular basis. He has not had any significant cough. He denies wheezing or flushing.  At his most recent visit in October the nodule showed further growth. I recommended we go ahead and resect the nodule. He was in agreement with that, but wanted to wait until sometime next year to do so. In the interim he has changed his mind and would like to go ahead and have the nodule resected the soon as possible.  Past Medical History  Diagnosis Date  . Osteoarthritis   . HTN (hypertension)   . GERD (gastroesophageal reflux disease)   . Chronic sinusitis   . Phlebitis and thrombophlebitis of lower extremities     Current Outpatient Prescriptions  Medication Sig Dispense Refill  . famotidine (PEPCID) 10 MG tablet Take 10 mg by mouth 2 (two) times daily.      . fluticasone (FLONASE) 50 MCG/ACT nasal spray Place 1 spray into the nose daily.     Marland Kitchen loratadine (CLARITIN) 10 MG tablet Take 10 mg by mouth daily.       No current facility-administered medications for this visit.    Physical Exam BP 155/93 mmHg  Pulse 68  Resp 16  Ht '5\' 10"'$  (1.778 m)  Wt 198 lb (89.812 kg)  BMI 28.41  kg/m2  SpO7 37% 19-year-old male in no acute distress Well-developed well-nourished Alert 3 no focal deficits No cervical lymphadenopathy regular rate and rhythm normal S1 and S2 Lungs clear with equal rise bilaterally Abdomen soft nontender Extremities without clubbing, cyanosis or edema  Diagnostic Tests: I personally reviewed his CT chest again. It shows a smoothly marginated right lower lobe nodule centrally.   CT CHEST WITHOUT CONTRAST  TECHNIQUE: Multidetector CT imaging of the chest was performed following the standard protocol without IV contrast.  COMPARISON: 05/18/2014  FINDINGS: Mediastinum: The heart size appears normal. No pericardial effusion. Aortic atherosclerosis noted. Calcified left hilar lymph node identified. No mediastinal or hilar adenopathy.  Lungs/Pleura: The right lower lobe perihilar lung nodule measures 1.3 x 1.1 cm, image 31/ series 4. On the most recent previous exam this measured 1.2 x 0.8 cm. On study from 08/03/2011 this nodule measured 0.8 x 0.7 cm. Calcified granuloma is again noted in the left upper lobe. No new suspicious nodules identified.  Upper Abdomen: No suspicious liver abnormality. The gallbladder appears normal. No biliary dilatation. Unremarkable appearance of the spleen. Stable low-attenuation nodule in the right adrenal gland which measures 11 mm. Likely adenoma. The left adrenal gland appears  normal.  Musculoskeletal: Mild spondylosis noted within the thoracic spine. No aggressive lytic or sclerotic bone lesions.  IMPRESSION: 1. There has been continued increase in size of right upper lobe perihilar lung nodule. Findings are suspicious for a slow growing, indolent neoplasm. Recommend further evaluation with PET-CT and/or tissue sampling.   Electronically Signed  By: Kerby Moors M.D.  On: 05/17/2015 09:39  Impression: 59 year old man with a slowly growing right lung nodule. He is a former smoker  having quit in 2012 when he was diagnosed with DVT and PE. The nodule appears benign, possibly a hamartoma. But with continued growth there is concern that this could represent a low-grade neoplasm such as a carcinoid tumor. In any event with continued growth, surgical resection is indicated.  I described the proposed operation to Mr. Marinaro and his wife. I reviewed the general nature of the procedure, the need for general anesthesia, the incisions to be used, the expected hospital stay, and the overall recovery. I reviewed the indications, risks, benefits, and alternatives. He understands risks include, but are not limited to death, MI, DVT, PE, bleeding, possible need for transfusion, infection, prolonged air leak, cardiac arrhythmias, as well as the possibility of other unforeseeable complications.  He is at higher risk for DVT given his previous history. We will plan to use sequential compression devices and enoxaparin for DVT prophylaxis.   Plan: Right VATS, resection of right lung nodule, possible lobectomy on Wednesday, 07/06/2015  I spent 15 minutes with Mr. Szeto during this visit, greater than 50% was spent in counseling.  Melrose Nakayama, MD [facility] [phone]

## 2015-06-30 ENCOUNTER — Encounter (HOSPITAL_COMMUNITY): Payer: Self-pay

## 2015-06-30 ENCOUNTER — Encounter (HOSPITAL_COMMUNITY)
Admission: RE | Admit: 2015-06-30 | Discharge: 2015-06-30 | Disposition: A | Discharge status: Home / Self Care | Payer: Managed Care, Other (non HMO) | Source: Ambulatory Visit | Attending: Thoracic Surgery (Cardiothoracic Vascular Surgery) | Admitting: Thoracic Surgery (Cardiothoracic Vascular Surgery)

## 2015-06-30 VITALS — BP 130/80 | HR 78 | Temp 97.8°F | Resp 18 | Ht 70.0 in | Wt 203.8 lb

## 2015-06-30 DIAGNOSIS — Z01818 Encounter for other preprocedural examination: Secondary | ICD-10-CM | POA: Diagnosis not present

## 2015-06-30 DIAGNOSIS — Z86718 Personal history of other venous thrombosis and embolism: Secondary | ICD-10-CM | POA: Diagnosis not present

## 2015-06-30 DIAGNOSIS — Z79899 Other long term (current) drug therapy: Secondary | ICD-10-CM | POA: Insufficient documentation

## 2015-06-30 DIAGNOSIS — Z87891 Personal history of nicotine dependence: Secondary | ICD-10-CM | POA: Diagnosis not present

## 2015-06-30 DIAGNOSIS — K219 Gastro-esophageal reflux disease without esophagitis: Secondary | ICD-10-CM | POA: Diagnosis not present

## 2015-06-30 DIAGNOSIS — Z86711 Personal history of pulmonary embolism: Secondary | ICD-10-CM | POA: Diagnosis not present

## 2015-06-30 DIAGNOSIS — Z01812 Encounter for preprocedural laboratory examination: Secondary | ICD-10-CM | POA: Insufficient documentation

## 2015-06-30 DIAGNOSIS — I10 Essential (primary) hypertension: Secondary | ICD-10-CM | POA: Insufficient documentation

## 2015-06-30 DIAGNOSIS — R911 Solitary pulmonary nodule: Secondary | ICD-10-CM | POA: Diagnosis not present

## 2015-06-30 DIAGNOSIS — Z0183 Encounter for blood typing: Secondary | ICD-10-CM | POA: Diagnosis not present

## 2015-06-30 HISTORY — DX: Acute embolism and thrombosis of unspecified deep veins of unspecified lower extremity: I82.409

## 2015-06-30 LAB — BLOOD GAS, ARTERIAL
ACID-BASE DEFICIT: 2.2 mmol/L — AB (ref 0.0–2.0)
BICARBONATE: 21 meq/L (ref 20.0–24.0)
DRAWN BY: 421801
FIO2: 0.21
O2 Saturation: 98.1 %
PATIENT TEMPERATURE: 98.6
PH ART: 7.463 — AB (ref 7.350–7.450)
TCO2: 21.9 mmol/L (ref 0–100)
pCO2 arterial: 29.8 mmHg — ABNORMAL LOW (ref 35.0–45.0)
pO2, Arterial: 105 mmHg — ABNORMAL HIGH (ref 80.0–100.0)

## 2015-06-30 LAB — COMPREHENSIVE METABOLIC PANEL
ALT: 23 U/L (ref 17–63)
AST: 20 U/L (ref 15–41)
Albumin: 3.8 g/dL (ref 3.5–5.0)
Alkaline Phosphatase: 67 U/L (ref 38–126)
Anion gap: 11 (ref 5–15)
BUN: 18 mg/dL (ref 6–20)
CHLORIDE: 105 mmol/L (ref 101–111)
CO2: 19 mmol/L — AB (ref 22–32)
Calcium: 8.9 mg/dL (ref 8.9–10.3)
Creatinine, Ser: 1.17 mg/dL (ref 0.61–1.24)
Glucose, Bld: 75 mg/dL (ref 65–99)
POTASSIUM: 4.3 mmol/L (ref 3.5–5.1)
SODIUM: 135 mmol/L (ref 135–145)
Total Bilirubin: 1 mg/dL (ref 0.3–1.2)
Total Protein: 6.6 g/dL (ref 6.5–8.1)

## 2015-06-30 LAB — CBC
HCT: 46.4 % (ref 39.0–52.0)
Hemoglobin: 15.4 g/dL (ref 13.0–17.0)
MCH: 29.4 pg (ref 26.0–34.0)
MCHC: 33.2 g/dL (ref 30.0–36.0)
MCV: 88.7 fL (ref 78.0–100.0)
PLATELETS: 178 10*3/uL (ref 150–400)
RBC: 5.23 MIL/uL (ref 4.22–5.81)
RDW: 13.8 % (ref 11.5–15.5)
WBC: 8.3 10*3/uL (ref 4.0–10.5)

## 2015-06-30 LAB — URINALYSIS, ROUTINE W REFLEX MICROSCOPIC
BILIRUBIN URINE: NEGATIVE
GLUCOSE, UA: NEGATIVE mg/dL
Hgb urine dipstick: NEGATIVE
KETONES UR: NEGATIVE mg/dL
Leukocytes, UA: NEGATIVE
Nitrite: NEGATIVE
PH: 6 (ref 5.0–8.0)
Protein, ur: NEGATIVE mg/dL
Specific Gravity, Urine: 1.008 (ref 1.005–1.030)

## 2015-06-30 LAB — PROTIME-INR
INR: 1.01 (ref 0.00–1.49)
PROTHROMBIN TIME: 13.5 s (ref 11.6–15.2)

## 2015-06-30 LAB — SURGICAL PCR SCREEN
MRSA, PCR: NEGATIVE
Staphylococcus aureus: NEGATIVE

## 2015-06-30 LAB — APTT: APTT: 28 s (ref 24–37)

## 2015-06-30 LAB — ABO/RH: ABO/RH(D): O POS

## 2015-06-30 NOTE — Pre-Procedure Instructions (Signed)
Brett Holland  06/30/2015      [name] [name] [id number]: [phone]: [phone]   Your procedure is scheduled on Dec 7  [place]  Call this number if you have problems the morning of surgery:  [phone]   Remember:  Do not eat food or drink liquids after midnight.  Take these medicines the morning of surgery with A SIP OF WATER famotidine (Pepcid), Flonase nasal spray, loratadine (Claritin)  Stop taking Asprin, Ibuprofen, BC's, Goody's, Aleve, Herbal medications, Fish Oil   Do not wear jewelry, make-up or nail polish.  Do not wear lotions, powders, or perfumes.  You may wear deodorant.  Do not shave 48 hours prior to surgery.  Men may shave face and neck.  Do not bring valuables to the hospital.  [facility] or [facility]ables.  Contacts, dentures or bridgework may not be worn into surgery.  Leave your suitcase in the car.  After surgery it may be brought to your room.  For patients admitted to the hospital, discharge time will be determined by your treatment team.  Patients discharged the day of surgery will not be allowed to drive home.    Special instructions:  [name] - Preparing for Surgery  Before surgery, you can play an important role.  Because skin is not sterile, your skin needs to be as free of germs as possible.  You can reduce the number of germs on you skin by washing with CHG (chlorahexidine gluconate) soap before surgery.  CHG is an antiseptic cleaner which kills germs and bonds with the skin to continue killing germs even after washing.  Please DO NOT use if you have an allergy to CHG or antibacterial soaps.  If your skin becomes reddened/irritated stop using the CHG and inform your nurse when you arrive at Short Stay.  Do not shave (including legs and underarms) for at least 48 hours  prior to the first CHG shower.  You may shave your face.  Please follow these instructions carefully:   1.  Shower with CHG Soap the night before surgery and the                                morning of Surgery.  2.  If you choose to wash your hair, wash your hair first as usual with your       normal shampoo.  3.  After you shampoo, rinse your hair and body thoroughly to remove the                      Shampoo.  4.  Use CHG as you would any other liquid soap.  You can apply chg directly       to the skin and wash gently with scrungie or a clean washcloth.  5.  Apply the CHG Soap to your body ONLY FROM THE NECK DOWN.        Do not use on open wounds or open sores.  Avoid contact with your eyes,       ears, mouth and genitals (private parts).  Wash genitals (private parts)       with your normal soap.  6.  Wash thoroughly, paying special attention to the area where your surgery  will be performed.  7.  Thoroughly rinse your body with warm water from the neck down.  8.  DO NOT shower/wash with your normal soap after using and rinsing off       the CHG Soap.  9.  Pat yourself dry with a clean towel.            10.  Wear clean pajamas.            11.  Place clean sheets on your bed the night of your first shower and do not        sleep with pets.  Day of Surgery  Do not apply any lotions/deoderants the morning of surgery.  Please wear clean clothes to the hospital/surgery center.     Please read over the following fact sheets that you were given. Pain Booklet, Coughing and Deep Breathing, Blood Transfusion Information, MRSA Information and Surgical Site Infection Prevention

## 2015-06-30 NOTE — Progress Notes (Signed)
PCP is Dr Janie Morning Denies ever seeing a cardiologist, but a note noted in epic from Dr Johnsie Cancel.  Stress test noted from 2014 Denies ever having an echo or card cath.

## 2015-07-04 ENCOUNTER — Encounter (HOSPITAL_COMMUNITY): Payer: Self-pay

## 2015-07-04 NOTE — Progress Notes (Signed)
Anesthesia Chart Review: Patient is a 59 year old male scheduled for right VATS, resection of right lung nodule, possible lobectomy on 07/06/15 by Dr. Roxan Hockey.  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 PCP is Dr. Janie Morning. He is not routinely followed by a cardiologist, but saw Dr. Johnsie Cancel in 2014 for evaluation of a single episode of chest pain. Stress test showed question of mild anterior ischemia versus attenuation. Cardiac CT did not appear to show any significant CAD.  Meds include Pepcid, Flonase, Claritin.  06/30/15 EKG: NSR, low voltage QRS.  02/09/13 Nuclear stress test ([facility]): Good exercise capacity. Abnormal exercise perfusion scan consistent with ischemia of the anterior wall of the LV. (Hot pot was noted in the anterior wall of the LV, mild ischemia is suspected. Findings may be secondary to anterior attenuation.) Preserved systolic function. Calculated EF 54%.  Dr. Johnsie Cancel reviewed the report and actual images which patient brought on disc. He wrote, "Suboptimal study with "hot spot" on anterior wall. Study looked normal to me especially on gray scale imaging Stress test portion also normal with HTN response Read as anterior ischemia. Patient has not had recurrent pain. Very concerned about his heart due to family history of premature CAD." Cardiac CT ordered (see below).  03/03/13 Cardiac CT: IMPRESSION: 1. The study was affected by misregistration artifact that involved the mid-point of each major vessel. There does not, however, appear to be any significant coronary disease and it was possible to read through the artifact. 2. Coronary artery calcium score 0 Agatston units. This suggests low risk of future cardiac events.  He is for a CXR on the day of surgery.   Preoperative labs noted.  Cardiac CT just over two years ago showed calcium  score of 0 and did not appear to show any significant CAD. If no acute changes then I would anticipate that he could proceed as planned.  Brett Holland [facility] Short Stay Center/Anesthesiology Phone [phone] 07/04/2015 11:52 AM

## 2015-07-05 MED ORDER — DEXTROSE 5 % IV SOLN
1.5000 g | INTRAVENOUS | Status: AC
  Administered 2015-07-06 (×2): 1.5 g via INTRAVENOUS
  Filled 2015-07-05: qty 1.5

## 2015-07-06 ENCOUNTER — Inpatient Hospital Stay (HOSPITAL_COMMUNITY): Payer: Managed Care, Other (non HMO) | Admitting: Vascular Surgery

## 2015-07-06 ENCOUNTER — Inpatient Hospital Stay (HOSPITAL_COMMUNITY): Payer: Managed Care, Other (non HMO) | Admitting: Certified Registered Nurse Anesthetist

## 2015-07-06 ENCOUNTER — Encounter (HOSPITAL_COMMUNITY)
Admission: RE | Disposition: A | Discharge status: Home / Self Care | Payer: Self-pay | Source: Ambulatory Visit | Attending: Thoracic Surgery (Cardiothoracic Vascular Surgery)

## 2015-07-06 ENCOUNTER — Inpatient Hospital Stay (HOSPITAL_COMMUNITY)
Admission: RE | Admit: 2015-07-06 | Discharge: 2015-07-11 | DRG: 165 | Disposition: A | Discharge status: Home / Self Care | Payer: Managed Care, Other (non HMO) | Source: Ambulatory Visit | Attending: Thoracic Surgery (Cardiothoracic Vascular Surgery) | Admitting: Thoracic Surgery (Cardiothoracic Vascular Surgery)

## 2015-07-06 ENCOUNTER — Inpatient Hospital Stay (HOSPITAL_COMMUNITY): Payer: Managed Care, Other (non HMO)

## 2015-07-06 ENCOUNTER — Encounter (HOSPITAL_COMMUNITY): Payer: Self-pay | Admitting: Certified Registered Nurse Anesthetist

## 2015-07-06 DIAGNOSIS — Z452 Encounter for adjustment and management of vascular access device: Secondary | ICD-10-CM

## 2015-07-06 DIAGNOSIS — Z86718 Personal history of other venous thrombosis and embolism: Secondary | ICD-10-CM

## 2015-07-06 DIAGNOSIS — Z87891 Personal history of nicotine dependence: Secondary | ICD-10-CM

## 2015-07-06 DIAGNOSIS — C3491 Malignant neoplasm of unspecified part of right bronchus or lung: Secondary | ICD-10-CM | POA: Diagnosis present

## 2015-07-06 DIAGNOSIS — Z7951 Long term (current) use of inhaled steroids: Secondary | ICD-10-CM | POA: Diagnosis not present

## 2015-07-06 DIAGNOSIS — R911 Solitary pulmonary nodule: Secondary | ICD-10-CM | POA: Diagnosis present

## 2015-07-06 DIAGNOSIS — K219 Gastro-esophageal reflux disease without esophagitis: Secondary | ICD-10-CM | POA: Diagnosis present

## 2015-07-06 DIAGNOSIS — I10 Essential (primary) hypertension: Secondary | ICD-10-CM | POA: Diagnosis present

## 2015-07-06 DIAGNOSIS — Z902 Acquired absence of lung [part of]: Secondary | ICD-10-CM

## 2015-07-06 DIAGNOSIS — K3189 Other diseases of stomach and duodenum: Secondary | ICD-10-CM | POA: Diagnosis not present

## 2015-07-06 HISTORY — PX: VIDEO ASSISTED THORACOSCOPY (VATS)/WEDGE RESECTION: SHX6174

## 2015-07-06 HISTORY — PX: PNEUMONECTOMY: SHX168

## 2015-07-06 LAB — POCT I-STAT 3, ART BLOOD GAS (G3+)
Acid-base deficit: 6 mmol/L — ABNORMAL HIGH (ref 0.0–2.0)
Bicarbonate: 20.8 mEq/L (ref 20.0–24.0)
O2 SAT: 93 %
PCO2 ART: 41.7 mmHg (ref 35.0–45.0)
PO2 ART: 71 mmHg — AB (ref 80.0–100.0)
Patient temperature: 97.2
TCO2: 22 mmol/L (ref 0–100)
pH, Arterial: 7.302 — ABNORMAL LOW (ref 7.350–7.450)

## 2015-07-06 LAB — PREPARE RBC (CROSSMATCH)

## 2015-07-06 SURGERY — VIDEO ASSISTED THORACOSCOPY (VATS)/WEDGE RESECTION
Anesthesia: General | Site: Chest | Laterality: Right

## 2015-07-06 MED ORDER — PROPOFOL 10 MG/ML IV BOLUS
INTRAVENOUS | Status: AC
  Filled 2015-07-06: qty 40

## 2015-07-06 MED ORDER — FENTANYL CITRATE (PF) 250 MCG/5ML IJ SOLN
INTRAMUSCULAR | Status: AC
  Filled 2015-07-06: qty 5

## 2015-07-06 MED ORDER — HYDROMORPHONE HCL 1 MG/ML IJ SOLN
INTRAMUSCULAR | Status: AC
  Administered 2015-07-06: 0.5 mg via INTRAVENOUS
  Filled 2015-07-06: qty 1

## 2015-07-06 MED ORDER — ARTIFICIAL TEARS OP OINT
TOPICAL_OINTMENT | OPHTHALMIC | Status: DC | PRN
  Administered 2015-07-06: 1 via OPHTHALMIC

## 2015-07-06 MED ORDER — KCL IN DEXTROSE-NACL 20-5-0.45 MEQ/L-%-% IV SOLN
INTRAVENOUS | Status: AC
  Administered 2015-07-06: 18:00:00 via INTRAVENOUS
  Filled 2015-07-06 (×3): qty 1000

## 2015-07-06 MED ORDER — GLYCOPYRROLATE 0.2 MG/ML IJ SOLN
INTRAMUSCULAR | Status: DC | PRN
  Administered 2015-07-06 (×2): 0.4 mg via INTRAVENOUS

## 2015-07-06 MED ORDER — LIDOCAINE HCL (CARDIAC) 20 MG/ML IV SOLN
INTRAVENOUS | Status: AC
  Filled 2015-07-06: qty 5

## 2015-07-06 MED ORDER — FENTANYL 40 MCG/ML IV SOLN
INTRAVENOUS | Status: AC
  Administered 2015-07-06: 15 ug via INTRAVENOUS
  Administered 2015-07-06: 45 ug via INTRAVENOUS
  Administered 2015-07-07: 65 ug via INTRAVENOUS
  Administered 2015-07-07 (×2): 105 ug via INTRAVENOUS
  Administered 2015-07-07: 75 ug via INTRAVENOUS
  Administered 2015-07-07: 60 ug via INTRAVENOUS
  Administered 2015-07-08: 105 ug via INTRAVENOUS
  Administered 2015-07-08: 90 ug via INTRAVENOUS
  Filled 2015-07-06: qty 25

## 2015-07-06 MED ORDER — POTASSIUM CHLORIDE 10 MEQ/50ML IV SOLN: 10.0000 meq | Freq: Every day | INTRAVENOUS | Status: DC | PRN

## 2015-07-06 MED ORDER — PHENYLEPHRINE HCL 10 MG/ML IJ SOLN
10.0000 mg | INTRAVENOUS | Status: DC | PRN
  Administered 2015-07-06: 10 ug/min via INTRAVENOUS

## 2015-07-06 MED ORDER — FLUTICASONE PROPIONATE 50 MCG/ACT NA SUSP
1.0000 | Freq: Every day | NASAL | Status: DC
  Administered 2015-07-07 – 2015-07-11 (×4): 1 via NASAL
  Filled 2015-07-06 (×2): qty 16

## 2015-07-06 MED ORDER — LACTATED RINGERS IV SOLN
INTRAVENOUS | Status: DC | PRN
  Administered 2015-07-06: 08:00:00 via INTRAVENOUS

## 2015-07-06 MED ORDER — DEXTROSE 5 % IV SOLN
1.5000 g | INTRAVENOUS | Status: DC
  Filled 2015-07-06: qty 1.5

## 2015-07-06 MED ORDER — OXYCODONE HCL 5 MG/5ML PO SOLN: 5.0000 mg | Freq: Once | ORAL | Status: DC | PRN

## 2015-07-06 MED ORDER — SODIUM CHLORIDE 0.9 % IJ SOLN: 9.0000 mL | INTRAMUSCULAR | Status: DC | PRN

## 2015-07-06 MED ORDER — ARTIFICIAL TEARS OP OINT
TOPICAL_OINTMENT | OPHTHALMIC | Status: AC
  Filled 2015-07-06: qty 3.5

## 2015-07-06 MED ORDER — GLYCOPYRROLATE 0.2 MG/ML IJ SOLN
INTRAMUSCULAR | Status: AC
  Filled 2015-07-06: qty 4

## 2015-07-06 MED ORDER — LACTATED RINGERS IV SOLN
INTRAVENOUS | Status: DC | PRN
  Administered 2015-07-06 (×2): via INTRAVENOUS

## 2015-07-06 MED ORDER — SENNOSIDES-DOCUSATE SODIUM 8.6-50 MG PO TABS
1.0000 | ORAL_TABLET | Freq: Every day | ORAL | Status: DC
  Administered 2015-07-07 – 2015-07-09 (×3): 1 via ORAL
  Filled 2015-07-06 (×4): qty 1

## 2015-07-06 MED ORDER — ONDANSETRON HCL 4 MG/2ML IJ SOLN
4.0000 mg | Freq: Four times a day (QID) | INTRAMUSCULAR | Status: DC | PRN
  Administered 2015-07-06: 4 mg via INTRAVENOUS
  Filled 2015-07-06 (×2): qty 2

## 2015-07-06 MED ORDER — PROPOFOL 10 MG/ML IV BOLUS
INTRAVENOUS | Status: DC | PRN
  Administered 2015-07-06: 30 mg via INTRAVENOUS
  Administered 2015-07-06: 100 mg via INTRAVENOUS

## 2015-07-06 MED ORDER — OXYCODONE HCL 5 MG PO TABS: 5.0000 mg | ORAL_TABLET | Freq: Once | ORAL | Status: DC | PRN

## 2015-07-06 MED ORDER — ACETAMINOPHEN 325 MG PO TABS: 325.0000 mg | ORAL_TABLET | ORAL | Status: DC | PRN

## 2015-07-06 MED ORDER — BUPIVACAINE HCL (PF) 0.5 % IJ SOLN
INTRAMUSCULAR | Status: DC | PRN
  Administered 2015-07-06: 5 mL

## 2015-07-06 MED ORDER — ROCURONIUM BROMIDE 50 MG/5ML IV SOLN
INTRAVENOUS | Status: AC
  Filled 2015-07-06: qty 1

## 2015-07-06 MED ORDER — ACETAMINOPHEN 500 MG PO TABS
1000.0000 mg | ORAL_TABLET | Freq: Four times a day (QID) | ORAL | Status: DC
  Administered 2015-07-07 – 2015-07-09 (×9): 1000 mg via ORAL
  Filled 2015-07-06 (×10): qty 2

## 2015-07-06 MED ORDER — LORATADINE 10 MG PO TABS
10.0000 mg | ORAL_TABLET | Freq: Every day | ORAL | Status: DC
  Administered 2015-07-07 – 2015-07-11 (×5): 10 mg via ORAL
  Filled 2015-07-06 (×5): qty 1

## 2015-07-06 MED ORDER — FAMOTIDINE 20 MG PO TABS
10.0000 mg | ORAL_TABLET | Freq: Two times a day (BID) | ORAL | Status: DC
  Administered 2015-07-06 – 2015-07-11 (×9): 10 mg via ORAL
  Filled 2015-07-06 (×13): qty 1

## 2015-07-06 MED ORDER — FENTANYL CITRATE (PF) 100 MCG/2ML IJ SOLN
INTRAMUSCULAR | Status: DC | PRN
  Administered 2015-07-06: 25 ug via INTRAVENOUS
  Administered 2015-07-06 (×4): 50 ug via INTRAVENOUS
  Administered 2015-07-06: 150 ug via INTRAVENOUS
  Administered 2015-07-06: 100 ug via INTRAVENOUS
  Administered 2015-07-06 (×4): 50 ug via INTRAVENOUS
  Administered 2015-07-06: 25 ug via INTRAVENOUS
  Administered 2015-07-06 (×2): 50 ug via INTRAVENOUS
  Administered 2015-07-06: 25 ug via INTRAVENOUS
  Administered 2015-07-06 (×2): 50 ug via INTRAVENOUS
  Administered 2015-07-06: 25 ug via INTRAVENOUS
  Administered 2015-07-06: 100 ug via INTRAVENOUS
  Administered 2015-07-06: 50 ug via INTRAVENOUS
  Administered 2015-07-06: 25 ug via INTRAVENOUS
  Administered 2015-07-06: 50 ug via INTRAVENOUS
  Administered 2015-07-06: 25 ug via INTRAVENOUS

## 2015-07-06 MED ORDER — BUPIVACAINE 0.5 % ON-Q PUMP SINGLE CATH 400 ML
400.0000 mL | INJECTION | Status: DC
  Filled 2015-07-06: qty 400

## 2015-07-06 MED ORDER — NALOXONE HCL 0.4 MG/ML IJ SOLN
0.4000 mg | INTRAMUSCULAR | Status: DC | PRN
  Filled 2015-07-06: qty 1

## 2015-07-06 MED ORDER — ONDANSETRON HCL 4 MG/2ML IJ SOLN
INTRAMUSCULAR | Status: DC | PRN
  Administered 2015-07-06: 4 mg via INTRAVENOUS

## 2015-07-06 MED ORDER — HYDROMORPHONE HCL 1 MG/ML IJ SOLN
0.2500 mg | INTRAMUSCULAR | Status: DC | PRN
  Administered 2015-07-06 (×2): 0.5 mg via INTRAVENOUS

## 2015-07-06 MED ORDER — BUPIVACAINE HCL (PF) 0.5 % IJ SOLN
INTRAMUSCULAR | Status: AC
  Filled 2015-07-06: qty 10

## 2015-07-06 MED ORDER — KETOROLAC TROMETHAMINE 30 MG/ML IJ SOLN
30.0000 mg | Freq: Four times a day (QID) | INTRAMUSCULAR | Status: AC | PRN
  Administered 2015-07-09: 30 mg via INTRAVENOUS
  Filled 2015-07-06: qty 1

## 2015-07-06 MED ORDER — NEOSTIGMINE METHYLSULFATE 10 MG/10ML IV SOLN
INTRAVENOUS | Status: DC | PRN
  Administered 2015-07-06: 2 mg via INTRAVENOUS
  Administered 2015-07-06: 3 mg via INTRAVENOUS

## 2015-07-06 MED ORDER — ACETAMINOPHEN 160 MG/5ML PO SOLN: 325.0000 mg | ORAL | Status: DC | PRN

## 2015-07-06 MED ORDER — ONDANSETRON HCL 4 MG/2ML IJ SOLN
4.0000 mg | Freq: Four times a day (QID) | INTRAMUSCULAR | Status: DC | PRN
  Administered 2015-07-09 – 2015-07-10 (×4): 4 mg via INTRAVENOUS
  Filled 2015-07-06 (×4): qty 2

## 2015-07-06 MED ORDER — ACETAMINOPHEN 160 MG/5ML PO SOLN: 1000.0000 mg | Freq: Four times a day (QID) | ORAL | Status: DC

## 2015-07-06 MED ORDER — MIDAZOLAM HCL 5 MG/5ML IJ SOLN
INTRAMUSCULAR | Status: DC | PRN
  Administered 2015-07-06: 2 mg via INTRAVENOUS

## 2015-07-06 MED ORDER — SODIUM CHLORIDE 0.9 % IV SOLN
Freq: Once | INTRAVENOUS | Status: DC
Start: 2015-07-06 — End: 2015-07-06

## 2015-07-06 MED ORDER — BISACODYL 5 MG PO TBEC
10.0000 mg | DELAYED_RELEASE_TABLET | Freq: Every day | ORAL | Status: DC
  Administered 2015-07-07 – 2015-07-09 (×3): 10 mg via ORAL
  Filled 2015-07-06 (×5): qty 2

## 2015-07-06 MED ORDER — BUPIVACAINE ON-Q PAIN PUMP (FOR ORDER SET NO CHG)
INJECTION | Status: AC
  Filled 2015-07-06: qty 1

## 2015-07-06 MED ORDER — SODIUM BICARBONATE 8.4 % IV SOLN
50.0000 meq | Freq: Once | INTRAVENOUS | Status: AC
  Administered 2015-07-06: 50 meq via INTRAVENOUS
  Filled 2015-07-06: qty 50

## 2015-07-06 MED ORDER — BUPIVACAINE 0.5 % ON-Q PUMP SINGLE CATH 400 ML
INJECTION | Status: DC | PRN
  Administered 2015-07-06: 400 mL

## 2015-07-06 MED ORDER — 0.9 % SODIUM CHLORIDE (POUR BTL) OPTIME
TOPICAL | Status: DC | PRN
  Administered 2015-07-06: 2000 mL

## 2015-07-06 MED ORDER — OXYCODONE HCL 5 MG PO TABS
5.0000 mg | ORAL_TABLET | ORAL | Status: DC | PRN
  Administered 2015-07-07 (×2): 5 mg via ORAL
  Administered 2015-07-08 – 2015-07-09 (×6): 10 mg via ORAL
  Administered 2015-07-10 – 2015-07-11 (×5): 5 mg via ORAL
  Administered 2015-07-11: 10 mg via ORAL
  Filled 2015-07-06 (×3): qty 2
  Filled 2015-07-06: qty 1
  Filled 2015-07-06 (×5): qty 2
  Filled 2015-07-06 (×2): qty 1
  Filled 2015-07-06: qty 2
  Filled 2015-07-06: qty 1
  Filled 2015-07-06 (×2): qty 2

## 2015-07-06 MED ORDER — HEMOSTATIC AGENTS (NO CHARGE) OPTIME
TOPICAL | Status: DC | PRN
  Administered 2015-07-06: 1 via TOPICAL

## 2015-07-06 MED ORDER — DIPHENHYDRAMINE HCL 12.5 MG/5ML PO ELIX
12.5000 mg | ORAL_SOLUTION | Freq: Four times a day (QID) | ORAL | Status: DC | PRN
  Administered 2015-07-07 – 2015-07-08 (×5): 12.5 mg via ORAL
  Filled 2015-07-06: qty 10
  Filled 2015-07-06 (×3): qty 5
  Filled 2015-07-06: qty 10
  Filled 2015-07-06 (×2): qty 5

## 2015-07-06 MED ORDER — MIDAZOLAM HCL 2 MG/2ML IJ SOLN
INTRAMUSCULAR | Status: AC
  Filled 2015-07-06: qty 2

## 2015-07-06 MED ORDER — ROCURONIUM BROMIDE 100 MG/10ML IV SOLN
INTRAVENOUS | Status: DC | PRN
  Administered 2015-07-06: 10 mg via INTRAVENOUS
  Administered 2015-07-06: 50 mg via INTRAVENOUS
  Administered 2015-07-06: 10 mg via INTRAVENOUS
  Administered 2015-07-06: 20 mg via INTRAVENOUS
  Administered 2015-07-06: 10 mg via INTRAVENOUS
  Administered 2015-07-06 (×2): 5 mg via INTRAVENOUS
  Administered 2015-07-06: 10 mg via INTRAVENOUS

## 2015-07-06 MED ORDER — SUCCINYLCHOLINE CHLORIDE 20 MG/ML IJ SOLN
INTRAMUSCULAR | Status: AC
  Filled 2015-07-06: qty 2

## 2015-07-06 MED ORDER — ONDANSETRON HCL 4 MG/2ML IJ SOLN
INTRAMUSCULAR | Status: AC
  Filled 2015-07-06: qty 2

## 2015-07-06 MED ORDER — TRAMADOL HCL 50 MG PO TABS
50.0000 mg | ORAL_TABLET | Freq: Four times a day (QID) | ORAL | Status: DC | PRN
  Administered 2015-07-09: 100 mg via ORAL
  Filled 2015-07-06: qty 2

## 2015-07-06 MED ORDER — PHENYLEPHRINE 40 MCG/ML (10ML) SYRINGE FOR IV PUSH (FOR BLOOD PRESSURE SUPPORT)
PREFILLED_SYRINGE | INTRAVENOUS | Status: AC
  Filled 2015-07-06: qty 10

## 2015-07-06 MED ORDER — SODIUM CHLORIDE 0.9 % IJ SOLN
INTRAMUSCULAR | Status: AC
  Filled 2015-07-06: qty 10

## 2015-07-06 MED ORDER — DEXTROSE 5 % IV SOLN
1.5000 g | Freq: Two times a day (BID) | INTRAVENOUS | Status: AC
  Administered 2015-07-06 – 2015-07-07 (×2): 1.5 g via INTRAVENOUS
  Filled 2015-07-06 (×3): qty 1.5

## 2015-07-06 MED ORDER — DIPHENHYDRAMINE HCL 50 MG/ML IJ SOLN
12.5000 mg | Freq: Four times a day (QID) | INTRAMUSCULAR | Status: DC | PRN
  Filled 2015-07-06: qty 0.25

## 2015-07-06 MED ORDER — EPHEDRINE SULFATE 50 MG/ML IJ SOLN
INTRAMUSCULAR | Status: AC
  Filled 2015-07-06: qty 1

## 2015-07-06 SURGICAL SUPPLY — 105 items
ADH SKN CLS APL DERMABOND .7 (GAUZE/BANDAGES/DRESSINGS)
APL SKNCLS STERI-STRIP NONHPOA (GAUZE/BANDAGES/DRESSINGS)
APPLIER CLIP 5 13 M/L LIGAMAX5 (MISCELLANEOUS) ×4
APPLIER CLIP LOGIC TI 5 (MISCELLANEOUS) ×2 IMPLANT
APR CLP MED LRG 33X5 (MISCELLANEOUS) ×2
APR CLP MED LRG 5 ANG JAW (MISCELLANEOUS) ×2
BAG SPEC RTRVL LRG 6X4 10 (ENDOMECHANICALS)
BENZOIN TINCTURE PRP APPL 2/3 (GAUZE/BANDAGES/DRESSINGS) ×2 IMPLANT
CANISTER SUCTION 2500CC (MISCELLANEOUS) ×8 IMPLANT
CATH KIT ON Q 5IN SLV (PAIN MANAGEMENT) IMPLANT
CATH KIT ON-Q SILVERSOAK 5 (CATHETERS) IMPLANT
CATH KIT ON-Q SILVERSOAK 5IN (CATHETERS) ×8 IMPLANT
CATH THORACIC 28FR (CATHETERS) IMPLANT
CATH THORACIC 36FR (CATHETERS) IMPLANT
CATH THORACIC 36FR RT ANG (CATHETERS) IMPLANT
CLIP APPLIE 5 13 M/L LIGAMAX5 (MISCELLANEOUS) ×1 IMPLANT
CLIP TI MEDIUM 6 (CLIP) ×4 IMPLANT
CLIP TI WIDE RED SMALL 24 (CLIP) ×2 IMPLANT
CONN ST 1/4X3/8  BEN (MISCELLANEOUS)
CONN ST 1/4X3/8 BEN (MISCELLANEOUS) IMPLANT
CONN Y 3/8X3/8X3/8  BEN (MISCELLANEOUS)
CONN Y 3/8X3/8X3/8 BEN (MISCELLANEOUS) IMPLANT
CONT SPEC 4OZ CLIKSEAL STRL BL (MISCELLANEOUS) ×38 IMPLANT
COVER BACK TABLE 60X90IN (DRAPES) ×2 IMPLANT
COVER SURGICAL LIGHT HANDLE (MISCELLANEOUS) ×4 IMPLANT
CUTTER ECHEON FLEX ENDO 45 340 (ENDOMECHANICALS) ×3 IMPLANT
DERMABOND ADVANCED (GAUZE/BANDAGES/DRESSINGS)
DERMABOND ADVANCED .7 DNX12 (GAUZE/BANDAGES/DRESSINGS) IMPLANT
DRAIN CHANNEL 28F RND 3/8 FF (WOUND CARE) IMPLANT
DRAIN CHANNEL 32F RND 10.7 FF (WOUND CARE) IMPLANT
DRAPE LAPAROSCOPIC ABDOMINAL (DRAPES) ×4 IMPLANT
DRAPE SLUSH/WARMER DISC (DRAPES) ×3 IMPLANT
DRAPE WARM FLUID 44X44 (DRAPE) ×2 IMPLANT
ELECT BLADE 6.5 EXT (BLADE) ×4 IMPLANT
ELECT REM PT RETURN 9FT ADLT (ELECTROSURGICAL) ×4
ELECTRODE REM PT RTRN 9FT ADLT (ELECTROSURGICAL) ×2 IMPLANT
GAUZE SPONGE 4X4 12PLY STRL (GAUZE/BANDAGES/DRESSINGS) ×4 IMPLANT
GLOVE BIO SURGEON STRL SZ 6 (GLOVE) ×3 IMPLANT
GLOVE BIO SURGEON STRL SZ7 (GLOVE) ×12 IMPLANT
GLOVE SURG SIGNA 7.5 PF LTX (GLOVE) ×8 IMPLANT
GOWN STRL REUS W/ TWL LRG LVL3 (GOWN DISPOSABLE) ×4 IMPLANT
GOWN STRL REUS W/ TWL XL LVL3 (GOWN DISPOSABLE) ×4 IMPLANT
GOWN STRL REUS W/TWL LRG LVL3 (GOWN DISPOSABLE) ×12
GOWN STRL REUS W/TWL XL LVL3 (GOWN DISPOSABLE) ×4
HANDLE STAPLE ENDO GIA SHORT (STAPLE)
HEMOSTAT SURGICEL 2X14 (HEMOSTASIS) IMPLANT
KIT BASIN OR (CUSTOM PROCEDURE TRAY) ×4 IMPLANT
KIT ROOM TURNOVER OR (KITS) ×4 IMPLANT
KIT SUCTION CATH 14FR (SUCTIONS) ×4 IMPLANT
MARKER SKIN DUAL TIP RULER LAB (MISCELLANEOUS) ×2 IMPLANT
NS IRRIG 1000ML POUR BTL (IV SOLUTION) ×9 IMPLANT
PACK CHEST (CUSTOM PROCEDURE TRAY) ×4 IMPLANT
PAD ARMBOARD 7.5X6 YLW CONV (MISCELLANEOUS) ×8 IMPLANT
PENCIL BUTTON HOLSTER BLD 10FT (ELECTRODE) ×2 IMPLANT
POUCH ENDO CATCH II 15MM (MISCELLANEOUS) ×3 IMPLANT
POUCH SPECIMEN RETRIEVAL 10MM (ENDOMECHANICALS) IMPLANT
RELOAD GOLD ECHELON 45 (STAPLE) ×26 IMPLANT
RELOAD GREEN ECHELON 45 (STAPLE) ×6 IMPLANT
RELOAD STAPLE 35X2.5 WHT THIN (STAPLE) IMPLANT
SCISSORS ENDO CVD 5DCS (MISCELLANEOUS) IMPLANT
SEALANT PROGEL (MISCELLANEOUS) IMPLANT
SEALANT SURG COSEAL 4ML (VASCULAR PRODUCTS) IMPLANT
SEALANT SURG COSEAL 8ML (VASCULAR PRODUCTS) IMPLANT
SOLUTION ANTI FOG 6CC (MISCELLANEOUS) ×4 IMPLANT
SPECIMEN JAR MEDIUM (MISCELLANEOUS) ×4 IMPLANT
SPONGE INTESTINAL PEANUT (DISPOSABLE) ×21 IMPLANT
SPONGE TONSIL 1 RF SGL (DISPOSABLE) ×4 IMPLANT
STAPLE RELOAD 2.5MM WHITE (STAPLE) ×36 IMPLANT
STAPLER ENDO GIA 12 SHRT THIN (STAPLE) IMPLANT
STAPLER ENDO GIA 12MM SHORT (STAPLE) IMPLANT
STAPLER VASCULAR ECHELON 35 (CUTTER) ×3 IMPLANT
SUT PROLENE 3 0 SH1 36 (SUTURE) ×3 IMPLANT
SUT PROLENE 4 0 RB 1 (SUTURE) ×12
SUT PROLENE 4-0 RB1 .5 CRCL 36 (SUTURE) IMPLANT
SUT SILK  1 MH (SUTURE) ×4
SUT SILK 1 MH (SUTURE) ×3 IMPLANT
SUT SILK 1 TIES 10X30 (SUTURE) ×4 IMPLANT
SUT SILK 2 0 SH (SUTURE) IMPLANT
SUT SILK 2 0SH CR/8 30 (SUTURE) IMPLANT
SUT SILK 3 0 SH 30 (SUTURE) IMPLANT
SUT SILK 3 0SH CR/8 30 (SUTURE) ×3 IMPLANT
SUT VIC AB 0 CTX 27 (SUTURE) IMPLANT
SUT VIC AB 1 CTX 27 (SUTURE) ×4 IMPLANT
SUT VIC AB 2-0 CT1 27 (SUTURE)
SUT VIC AB 2-0 CT1 TAPERPNT 27 (SUTURE) IMPLANT
SUT VIC AB 2-0 CTX 36 (SUTURE) ×4 IMPLANT
SUT VIC AB 3-0 MH 27 (SUTURE) IMPLANT
SUT VIC AB 3-0 SH 27 (SUTURE)
SUT VIC AB 3-0 SH 27X BRD (SUTURE) IMPLANT
SUT VIC AB 3-0 X1 27 (SUTURE) ×4 IMPLANT
SUT VICRYL 0 UR6 27IN ABS (SUTURE) ×2 IMPLANT
SUT VICRYL 2 TP 1 (SUTURE) IMPLANT
SWAB COLLECTION DEVICE MRSA (MISCELLANEOUS) IMPLANT
SYSTEM SAHARA CHEST DRAIN ATS (WOUND CARE) ×4 IMPLANT
TAPE UMBILICAL COTTON 1/8X30 (MISCELLANEOUS) ×3 IMPLANT
TIP APPLICATOR SPRAY EXTEND 16 (VASCULAR PRODUCTS) IMPLANT
TOWEL OR 17X24 6PK STRL BLUE (TOWEL DISPOSABLE) ×4 IMPLANT
TOWEL OR 17X26 10 PK STRL BLUE (TOWEL DISPOSABLE) ×8 IMPLANT
TRAP SPECIMEN MUCOUS 40CC (MISCELLANEOUS) IMPLANT
TRAY FOLEY CATH 16FRSI W/METER (SET/KITS/TRAYS/PACK) ×4 IMPLANT
TROCAR XCEL BLADELESS 5X75MML (TROCAR) ×4 IMPLANT
TROCAR XCEL NON-BLD 5MMX100MML (ENDOMECHANICALS) IMPLANT
TUBE ANAEROBIC SPECIMEN COL (MISCELLANEOUS) IMPLANT
TUNNELER SHEATH ON-Q 11GX8 DSP (PAIN MANAGEMENT) ×2 IMPLANT
WATER STERILE IRR 1000ML POUR (IV SOLUTION) ×4 IMPLANT

## 2015-07-06 NOTE — Interval H&P Note (Signed)
History and Physical Interval Note:  07/06/2015 7:47 AM  Brett Holland  has presented today for surgery, with the diagnosis of RLL NODULE  The various methods of treatment have been discussed with the patient and family. After consideration of risks, benefits and other options for treatment, the patient has consented to  Procedure(s): RIGHT VIDEO ASSISTED THORACOSCOPY (VATS)/WEDGE RESECTION OF RIGHT LUNG NODULE (Right) POSSIBLE LOBECTOMY (Right) as a surgical intervention .  The patient's history has been reviewed, patient examined, no change in status, stable for surgery.  I have reviewed the patient's chart and labs.  Questions were answered to the patient's satisfaction.     Melrose Nakayama

## 2015-07-06 NOTE — Transfer of Care (Signed)
Immediate Anesthesia Transfer of Care Note  Patient: Brett Holland  Procedure(s) Performed: Procedure(s): RIGHT VIDEO ASSISTED THORACOSCOPY (VATS) WITH PLACEMENT OF ON-Q PAIN PUMP (Right) RIGHT PNEUMONECTOMY (Right)  Patient Location: PACU  Anesthesia Type:General  Level of Consciousness: awake, oriented and patient cooperative  Airway & Oxygen Therapy: Patient Spontanous Breathing and Patient connected to nasal cannula oxygen  Post-op Assessment: Report given to RN, Post -op Vital signs reviewed and stable and Patient moving all extremities  Post vital signs: Reviewed and stable  Last Vitals:  Filed Vitals:   07/06/15 0647 07/06/15 1614  BP: 144/82   Pulse: 67   Temp: 36.4 C 36.2 C  Resp: 18     Complications: No apparent anesthesia complications

## 2015-07-06 NOTE — Anesthesia Preprocedure Evaluation (Addendum)
Anesthesia Evaluation  Patient identified by MRN, date of birth, ID band Patient awake    Reviewed: Allergy & Precautions, NPO status , Patient's Chart, lab work & pertinent test results  History of Anesthesia Complications Negative for: history of anesthetic complications  Airway Mallampati: III  TM Distance: >3 FB Neck ROM: Full    Dental  (+) Dental Advisory Given, Teeth Intact   Pulmonary neg shortness of breath, neg sleep apnea, neg COPD, neg recent URI, former smoker, PE   breath sounds clear to auscultation       Cardiovascular hypertension,  Rhythm:Regular     Neuro/Psych negative neurological ROS     GI/Hepatic Neg liver ROS, GERD  Medicated,  Endo/Other  negative endocrine ROS  Renal/GU negative Renal ROS     Musculoskeletal  (+) Arthritis , Osteoarthritis,    Abdominal   Peds  Hematology negative hematology ROS (+)   Anesthesia Other Findings   Reproductive/Obstetrics                            Anesthesia Physical Anesthesia Plan  ASA: II  Anesthesia Plan:    Post-op Pain Management:    Induction: Intravenous  Airway Management Planned: Double Lumen EBT  Additional Equipment: Arterial line  Intra-op Plan:   Post-operative Plan: Extubation in OR  Informed Consent: I have reviewed the patients History and Physical, chart, labs and discussed the procedure including the risks, benefits and alternatives for the proposed anesthesia with the patient or authorized representative who has indicated his/her understanding and acceptance.   Dental advisory given  Plan Discussed with: CRNA and Surgeon  Anesthesia Plan Comments:         Anesthesia Quick Evaluation

## 2015-07-06 NOTE — Brief Op Note (Addendum)
07/06/2015  4:03 PM  PATIENT:  Brett Holland  59 y.o. male  PRE-OPERATIVE DIAGNOSIS:  RLL NODULE  POST-OPERATIVE DIAGNOSIS:  NON-SMALL CELL CARCINOMA RIGHT LUNG  PROCEDURE:   RIGHT VIDEO ASSISTED THORACOSCOPY RIGHT PNEUMONECTOMY MEDIASTINAL LYMPH NODE DISSECTION ON-Q CATHETER PLACEMENT  SURGEON:  Melrose Nakayama, MD  ASSISTANT: Suzzanne Cloud, PA-C  ANESTHESIA:   general  SPECIMEN:  Source of Specimen:  right lung, lymph nodes  DISPOSITION OF SPECIMEN:  Pathology  DRAINS: 91 Fr CT  PATIENT CONDITION:  PACU - hemodynamically stable.  Frozen of level 12 node + adenocarcinoma. Frozen of node along main PA near origin of posterior ascending branch to RUL + NSCCA. Pneumonectomy was only way to get a clear margin. Final margins negative for tumor.

## 2015-07-06 NOTE — H&P (View-Only) (Signed)
[address]       [place] [address]             [phone]       HPI: [name] returns to discuss surgery for his right lower lobe nodule  He is a 59 year old man who had a DVT back in 2012. A CT of the chest showed a right lower lobe nodule in close proximity to the hilum. Given his recent DVT at that time we elected to follow the nodule rather than operate. We have followed him with scans since then. Initially, the nodule change very little. At his visit a year ago the nodule had grown a fair amount. 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 We discussed resection versus continued follow-up and decided to get another scan a year.  He's been doing well. He has not had any significant health issues over the past year. He does exercise on a regular basis. He has not had any significant cough. He denies wheezing or flushing.  At his most recent visit in October the nodule showed further growth. I recommended we go ahead and resect the nodule. He was in agreement with that, but wanted to wait until sometime next year to do so. In the interim he has changed his mind and would like to go ahead and have the nodule resected the soon as possible.  Past Medical History  Diagnosis Date  . Osteoarthritis   . HTN (hypertension)   . GERD (gastroesophageal reflux disease)   . Chronic sinusitis   . Phlebitis and thrombophlebitis of lower extremities     Current Outpatient Prescriptions  Medication Sig Dispense Refill  . famotidine (PEPCID) 10 MG tablet Take 10 mg by mouth 2 (two) times daily.      . fluticasone (FLONASE) 50 MCG/ACT nasal spray Place 1 spray into the nose daily.     Marland Kitchen loratadine (CLARITIN) 10 MG tablet Take 10 mg by mouth daily.       No current facility-administered medications for this visit.    Physical Exam BP 155/93 mmHg  Pulse 68  Resp 16  Ht '5\' 10"'$  (1.778 m)  Wt 198 lb (89.812 kg)  BMI 28.41  kg/m2  SpO74 57% 59-year-old male in no acute distress Well-developed well-nourished Alert 3 no focal deficits No cervical lymphadenopathy regular rate and rhythm normal S1 and S2 Lungs clear with equal rise bilaterally Abdomen soft nontender Extremities without clubbing, cyanosis or edema  Diagnostic Tests: I personally reviewed his CT chest again. It shows a smoothly marginated right lower lobe nodule centrally.   CT CHEST WITHOUT CONTRAST  TECHNIQUE: Multidetector CT imaging of the chest was performed following the standard protocol without IV contrast.  COMPARISON: 05/18/2014  FINDINGS: Mediastinum: The heart size appears normal. No pericardial effusion. Aortic atherosclerosis noted. Calcified left hilar lymph node identified. No mediastinal or hilar adenopathy.  Lungs/Pleura: The right lower lobe perihilar lung nodule measures 1.3 x 1.1 cm, image 31/ series 4. On the most recent previous exam this measured 1.2 x 0.8 cm. On study from 08/03/2011 this nodule measured 0.8 x 0.7 cm. Calcified granuloma is again noted in the left upper lobe. No new suspicious nodules identified.  Upper Abdomen: No suspicious liver abnormality. The gallbladder appears normal. No biliary dilatation. Unremarkable appearance of the spleen. Stable low-attenuation nodule in the right adrenal gland which measures 11 mm. Likely adenoma. The left adrenal gland appears  normal.  Musculoskeletal: Mild spondylosis noted within the thoracic spine. No aggressive lytic or sclerotic bone lesions.  IMPRESSION: 1. There has been continued increase in size of right upper lobe perihilar lung nodule. Findings are suspicious for a slow growing, indolent neoplasm. Recommend further evaluation with PET-CT and/or tissue sampling.   Electronically Signed  By: Kerby Moors M.D.  On: 05/17/2015 09:39  Impression: 59 year old man with a slowly growing right lung nodule. He is a former smoker  having quit in 2012 when he was diagnosed with DVT and PE. The nodule appears benign, possibly a hamartoma. But with continued growth there is concern that this could represent a low-grade neoplasm such as a carcinoid tumor. In any event with continued growth, surgical resection is indicated.  I described the proposed operation to Mr. Gallier and his wife. I reviewed the general nature of the procedure, the need for general anesthesia, the incisions to be used, the expected hospital stay, and the overall recovery. I reviewed the indications, risks, benefits, and alternatives. He understands risks include, but are not limited to death, MI, DVT, PE, bleeding, possible need for transfusion, infection, prolonged air leak, cardiac arrhythmias, as well as the possibility of other unforeseeable complications.  He is at higher risk for DVT given his previous history. We will plan to use sequential compression devices and enoxaparin for DVT prophylaxis.   Plan: Right VATS, resection of right lung nodule, possible lobectomy on Wednesday, 07/06/2015  I spent 15 minutes with Mr. Grose during this visit, greater than 50% was spent in counseling.  Melrose Nakayama, MD [facility] [phone]

## 2015-07-06 NOTE — Progress Notes (Signed)
Patient ID: Brett Holland, male   DOB: 08/03/1955, 59 y.o.   MRN: [id number]  SICU Evening Rounds:  Hemodynamically stable in sinus rhythm  sats 97%.   CT clamped per pneumonectomy protocol.  CXR ok  Urine output ok

## 2015-07-06 NOTE — Anesthesia Procedure Notes (Addendum)
Procedure Name: Intubation Date/Time: 07/06/2015 8:50 AM Performed by: Salli Quarry Kiri Hinderliter Pre-anesthesia Checklist: Patient identified, Emergency Drugs available, Suction available and Patient being monitored Patient Re-evaluated:Patient Re-evaluated prior to inductionOxygen Delivery Method: Circle system utilized Preoxygenation: Pre-oxygenation with 100% oxygen Intubation Type: IV induction Ventilation: Mask ventilation without difficulty Laryngoscope Size: Mac and 3 Grade View: Grade I Tube type: Oral Endobronchial tube: Left and Double lumen EBT and 39 Fr Number of attempts: 1 Airway Equipment and Method: Stylet and Fiberoptic brochoscope Placement Confirmation: ETT inserted through vocal cords under direct vision,  positive ETCO2 and breath sounds checked- equal and bilateral Secured at: 31 cm Tube secured with: Tape Dental Injury: Teeth and Oropharynx as per pre-operative assessment

## 2015-07-07 ENCOUNTER — Encounter (HOSPITAL_COMMUNITY): Payer: Self-pay | Admitting: Thoracic Surgery (Cardiothoracic Vascular Surgery)

## 2015-07-07 ENCOUNTER — Inpatient Hospital Stay (HOSPITAL_COMMUNITY): Payer: Managed Care, Other (non HMO)

## 2015-07-07 LAB — BASIC METABOLIC PANEL
Anion gap: 5 (ref 5–15)
BUN: 10 mg/dL (ref 6–20)
CALCIUM: 8.1 mg/dL — AB (ref 8.9–10.3)
CO2: 26 mmol/L (ref 22–32)
Chloride: 106 mmol/L (ref 101–111)
Creatinine, Ser: 0.96 mg/dL (ref 0.61–1.24)
GFR calc Af Amer: >60 mL/min (ref >60)
GLUCOSE: 129 mg/dL — AB (ref 65–99)
Potassium: 4.5 mmol/L (ref 3.5–5.1)
Sodium: 137 mmol/L (ref 135–145)

## 2015-07-07 LAB — CBC
HEMATOCRIT: 39.5 % (ref 39.0–52.0)
Hemoglobin: 12.8 g/dL — ABNORMAL LOW (ref 13.0–17.0)
MCH: 28.9 pg (ref 26.0–34.0)
MCHC: 32.4 g/dL (ref 30.0–36.0)
MCV: 89.2 fL (ref 78.0–100.0)
Platelets: 173 10*3/uL (ref 150–400)
RBC: 4.43 MIL/uL (ref 4.22–5.81)
RDW: 13.6 % (ref 11.5–15.5)
WBC: 15.3 10*3/uL — ABNORMAL HIGH (ref 4.0–10.5)

## 2015-07-07 LAB — POCT I-STAT 3, ART BLOOD GAS (G3+)
ACID-BASE EXCESS: 5 mmol/L — AB (ref 0.0–2.0)
BICARBONATE: 29.7 meq/L — AB (ref 20.0–24.0)
O2 Saturation: 99 %
TCO2: 31 mmol/L (ref 0–100)
pCO2 arterial: 43.1 mmHg (ref 35.0–45.0)
pH, Arterial: 7.447 (ref 7.350–7.450)
pO2, Arterial: 118 mmHg — ABNORMAL HIGH (ref 80.0–100.0)

## 2015-07-07 LAB — POCT I-STAT 7, (LYTES, BLD GAS, ICA,H+H)
BICARBONATE: 26.2 meq/L — AB (ref 20.0–24.0)
Calcium, Ion: 1.23 mmol/L (ref 1.13–1.30)
HCT: 43 % (ref 39.0–52.0)
Hemoglobin: 14.6 g/dL (ref 13.0–17.0)
O2 Saturation: 99 %
PH ART: 7.353 (ref 7.350–7.450)
POTASSIUM: 4.2 mmol/L (ref 3.5–5.1)
Patient temperature: 35.3
Sodium: 140 mmol/L (ref 135–145)
TCO2: 28 mmol/L (ref 0–100)
pCO2 arterial: 46.3 mmHg — ABNORMAL HIGH (ref 35.0–45.0)
pO2, Arterial: 128 mmHg — ABNORMAL HIGH (ref 80.0–100.0)

## 2015-07-07 LAB — POCT ACTIVATED CLOTTING TIME: ACTIVATED CLOTTING TIME: 142 s

## 2015-07-07 MED ORDER — PNEUMOCOCCAL VAC POLYVALENT 25 MCG/0.5ML IJ INJ: 0.5000 mL | INJECTION | INTRAMUSCULAR | Status: DC | PRN

## 2015-07-07 MED ORDER — ENOXAPARIN SODIUM 40 MG/0.4ML ~~LOC~~ SOLN
40.0000 mg | SUBCUTANEOUS | Status: DC
  Administered 2015-07-07 – 2015-07-11 (×5): 40 mg via SUBCUTANEOUS
  Filled 2015-07-07 (×5): qty 0.4

## 2015-07-07 MED ORDER — FUROSEMIDE 10 MG/ML IJ SOLN
20.0000 mg | Freq: Once | INTRAMUSCULAR | Status: AC
  Administered 2015-07-07: 20 mg via INTRAVENOUS
  Filled 2015-07-07: qty 2

## 2015-07-07 MED ORDER — LEVALBUTEROL HCL 0.63 MG/3ML IN NEBU: 0.6300 mg | INHALATION_SOLUTION | Freq: Four times a day (QID) | RESPIRATORY_TRACT | Status: DC | PRN

## 2015-07-07 NOTE — Plan of Care (Signed)
Problem: Phase I Progression Outcomes Goal: Hemodynamically stable Outcome: Progressing Pt requiring no drips. Goal: Chest tube drainage < 200 cc/hr Outcome: Not Met (add Reason) Chest tube is clamped per pneumonectomy protocol. Goal: Pain controlled with appropriate interventions Outcome: Progressing Pt on PCA

## 2015-07-07 NOTE — Progress Notes (Signed)
CT Surgery pm rounds  Breathing comfortably- O2 sat > 95% NSR I/O balance - 1800 cc L lung clear, trachea midline Walked in hall today

## 2015-07-07 NOTE — Progress Notes (Signed)
1 Day Post-Op Procedure(s) (LRB): RIGHT VIDEO ASSISTED THORACOSCOPY (VATS) WITH PLACEMENT OF ON-Q PAIN PUMP (Right) RIGHT PNEUMONECTOMY (Right) Subjective: Some incisional pain, denies nausea  Objective: Vital signs in last 24 hours: Temp:  [97.2 F (36.2 C)-98.4 F (36.9 C)] 98.3 F (36.8 C) (12/08 0739) Pulse Rate:  [71-115] 71 (12/08 0700) Cardiac Rhythm:  [-] Normal sinus rhythm (12/08 0733) Resp:  [12-24] 22 (12/08 0756) BP: (108-138)/(67-109) 132/74 mmHg (12/08 0700) SpO2:  [95 %-100 %] 99 % (12/08 0756) Arterial Line BP: (81-156)/(58-78) 150/69 mmHg (12/08 0700)  Hemodynamic parameters for last 24 hours:    Intake/Output from previous day: 12/07 0701 - 12/08 0700 In: 2350 [I.V.:2300; IV Piggyback:50] Out: 2150 [Urine:1650; Blood:500] Intake/Output this shift:    General appearance: alert, cooperative and no distress Neurologic: intact Heart: regular rate and rhythm Lungs: absent BS on right, faint wheeze on left Abdomen: normal findings: soft, non-tender  Lab Results:  Recent Labs  07/07/15 0335  WBC 15.3*  HGB 12.8*  HCT 39.5  PLT 173   BMET:  Recent Labs  07/07/15 0335  NA 137  K 4.5  CL 106  CO2 26  GLUCOSE 129*  BUN 10  CREATININE 0.96  CALCIUM 8.1*    PT/INR: No results for input(s): LABPROT, INR in the last 72 hours. ABG    Component Value Date/Time   PHART 7.447 07/07/2015 0604   HCO3 29.7* 07/07/2015 0604   TCO2 31 07/07/2015 0604   ACIDBASEDEF 6.0* 07/06/2015 1806   O2SAT 99.0 07/07/2015 0604   CBG (last 3)  No results for input(s): GLUCAP in the last 72 hours.  Assessment/Plan: S/P Procedure(s) (LRB): RIGHT VIDEO ASSISTED THORACOSCOPY (VATS) WITH PLACEMENT OF ON-Q PAIN PUMP (Right) RIGHT PNEUMONECTOMY (Right) POD # 1  CV- stable  RESP- s/p pneumonectomy- IS, nebs PRN  Dc chest tube  RENAL- creatinine and lytes OK- diurese this AM, dc Foley later today  ENDO- CBG OK  DVT prophylaxis- continue SCD, add  enoxaparin  OOB, ambulate   LOS: 1 day    Brett Holland 07/07/2015

## 2015-07-07 NOTE — Op Note (Signed)
NAMEMASTON, WIGHT NO.:  [id number]  MEDICAL RECORD NO.:  [id number]  LOCATION:  2S07C                        FACILITY:  [facility]  PHYSICIAN:  Revonda Standard. Roxan Hockey, M.D.DATE OF BIRTH:  1956/07/18  DATE OF PROCEDURE:  07/06/2015 DATE OF DISCHARGE:                              OPERATIVE REPORT   PREOPERATIVE DIAGNOSIS:  Right lower lobe nodule.  POSTOPERATIVE DIAGNOSIS:  Non-small cell carcinoma of right lung.  PROCEDURES:   Right video-assisted thoracoscopy Right pneumonectomy, Mediastinal lymph node dissection On-Q local anesthetic catheter placement.  SURGEON:  Revonda Standard. Roxan Hockey, M.D.  ASSISTANT:  Suzzanne Cloud, P.A.  ANESTHESIA:  General.  FINDINGS:  Nodule in major fissure(primary versus level 12 lymph node) between superior segment and right upper lobe, positive for non-small cell carcinoma. Incomplete fissures.   Due central location of tumor, unable to get margin on pulmonary artery; therefore, pneumonectomy performed to achieve complete resection.  CLINICAL NOTE:  [name] is a 59 year old gentleman, who has been followed for several years with a right lower lobe nodule. It was first found when he presented with deep venous thrombosis.  The nodule was in close proximity to the hilum.  He was followed with scans and initially showed little change.  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  A recent CT showed further growth and he was advised to undergo resection. The indications, risks, benefits, and alternatives were discussed in detail with the patient.  He understood and accepted the risk and agreed to proceed.  OPERATIVE NOTE:  [name] was brought to the preoperative holding area on July 06, 2015, Anesthesia placed a central line and an arterial blood pressure monitoring line.  He was taken to the operating room, anesthetized and intubated with a double-lumen endotracheal  tube. Intravenous antibiotics were administered.  A Foley catheter was placed. Sequential compressive devices were placed on the calves for DVT prophylaxis.  He was placed in the left lateral decubitus position and the right chest was prepped and draped in usual sterile fashion.  Single lung ventilation of the left lung was initiated and was tolerated well throughout the procedure.  An incision was made in the seventh intercostal space in the midaxillary line. It was carried through the skin and subcutaneous tissue.  A 5-mm port was inserted into the chest and the thoracoscope was advanced in the chest.  The lung was well isolated, but initially slow to deflate.  The lung did deflate nicely once suction was applied down the right side.  A working incision, 5-cm in length, was made in the fourth interspace anterolaterally.  No rib spreading was performed during the procedure. A second port incision was made anterior to the first for instrumentation.  The lung was grasped and an attempt was made to palpate the nodule, but the nodule could not be palpated.  The dissection was begun in the major fissure near the confluence of the major and minor fissures.  The fissure was incomplete in this area and throughout. After spending a good deal of time with the fissures, the inferior pulmonary ligament was divided and the pleural reflection was divided  at the hilum anteriorly and posteriorly, the nodule still could not be palpated and the dissection was again carried into the fissure. Ultimately, the posterior ascending branch to the right pulmonary artery was identified. There turned out to be two branches, this was more central one.  There was a nodule adjacent to this that was relatively adherent. Initially this was thought to be a level 12 lymph node, in retrospect it may have been the primary. This node was dissected off and sent for frozen section, which revealed non-small cell  carcinoma.  Given the finding of non-small cell carcinoma, it was clear that at least a lower lobectomy would be necessary and possibly a bilobectomy. The fissure between the middle and lower lobes then was completed anteriorly with endoscopic GIA stapler. As the dissection was carried down towards the pulmonary arterial branches, the tissue became firmly adherent and there was a node present takeoff of the middle lobe artery branch that appeared cancerous.  The decision was made to proceed with a bilobectomy.  The minor fissure between the upper lobe and middle lobe then was completed with sequential firings of endoscopic GIA stapler. This was a slow and tedious process as the fissure was poorly defined, but ultimately, the minor fissure was completed.  Dissection then was carried along the pulmonary artery and near the origin of the takeoff of the posterior ascending branch that was previously identified.  There was dense adherent tissue around the artery, which was tediously dissected off and sent for frozen section. That frozen also returned showing non-small cell carcinoma.  It was clear that there would not be an adequate margin on the pulmonary artery without sacrificing the lower and middle lobes and the two posterior branches of the upper lobe and the decision was made to proceed with a pneumonectomy for complete surgical resection.  The superior pulmonary vein was dissected out and divided with an endoscopic vascular stapler. The inferior pulmonary vein had been divided previously.  As the dissection was carried more superiorly, there was a level 10 node that was sent for pathology.  The decision was made to take the anterior and apical right upper lobe branches separately from the remainder of the pulmonary artery.  These were dissected out and divided with endoscopic vascular stapler.  The pulmonary artery then was encircled and divided with the endoscopic vascular stapler.   Additional nodes were present along the bronchus, these were swept off and taken with the specimen.  A stapler was placed across the bronchus, closed and fired transecting the bronchus.  The lung was placed into an endoscopic retrieval bag and removed through the working incision.  The bronchial and vascular margins were marked and the specimen was sent for frozen section of the margins, which returned negative.  While awaiting the frozen section results on the margin, the chest was copiously irrigated with warm saline.  A test inflation to 30 cm of water revealed no leakage from the bronchial stump.  Level 7 and 4R nodes were dissected out and sent as separate specimens.  A 28-French chest tube was placed through the anterior port incision and secured with a #1 silk suture.  An On-Q local anesthetic catheter was placed through a separate stab incision posteriorly.  It was primed with 5 mL of 0.5% bupivacaine.  The margins returned negative.  Final inspection was made for hemostasis.  The original port incision was closed with a 3-0 Vicryl suture.  The working incision was closed with a #1 Vicryl fascial  suture, a 2-0 Vicryl in the subcutaneous suture and a 3-0 Vicryl subcuticular suture.  Dermabond was applied.  The chest tube was placed to a Pleur-Evac.  An inflation of the left lung was performed and the chest tube was clamped.  The patient was placed back in a supine position.  He then was extubated in the operating room and taken to the postanesthetic care unit in good condition.     Revonda Standard Roxan Hockey, M.D.     SCH/MEDQ  D:  07/06/2015  T:  07/07/2015  Job:  [id number]

## 2015-07-08 ENCOUNTER — Inpatient Hospital Stay (HOSPITAL_COMMUNITY): Payer: Managed Care, Other (non HMO)

## 2015-07-08 LAB — COMPREHENSIVE METABOLIC PANEL
ALBUMIN: 2.7 g/dL — AB (ref 3.5–5.0)
ALT: 19 U/L (ref 17–63)
AST: 27 U/L (ref 15–41)
Alkaline Phosphatase: 52 U/L (ref 38–126)
Anion gap: 4 — ABNORMAL LOW (ref 5–15)
BILIRUBIN TOTAL: 0.7 mg/dL (ref 0.3–1.2)
BUN: 12 mg/dL (ref 6–20)
CO2: 31 mmol/L (ref 22–32)
CREATININE: 1.15 mg/dL (ref 0.61–1.24)
Calcium: 8.3 mg/dL — ABNORMAL LOW (ref 8.9–10.3)
Chloride: 101 mmol/L (ref 101–111)
GFR calc Af Amer: >60 mL/min (ref >60)
GLUCOSE: 118 mg/dL — AB (ref 65–99)
Potassium: 4 mmol/L (ref 3.5–5.1)
Sodium: 136 mmol/L (ref 135–145)
Total Protein: 5.3 g/dL — ABNORMAL LOW (ref 6.5–8.1)

## 2015-07-08 LAB — CBC
HEMATOCRIT: 39.8 % (ref 39.0–52.0)
HEMOGLOBIN: 13 g/dL (ref 13.0–17.0)
MCH: 29.5 pg (ref 26.0–34.0)
MCHC: 32.7 g/dL (ref 30.0–36.0)
MCV: 90.5 fL (ref 78.0–100.0)
Platelets: 174 10*3/uL (ref 150–400)
RBC: 4.4 MIL/uL (ref 4.22–5.81)
RDW: 13.9 % (ref 11.5–15.5)
WBC: 12.8 10*3/uL — AB (ref 4.0–10.5)

## 2015-07-08 MED ORDER — METOCLOPRAMIDE HCL 5 MG/ML IJ SOLN
10.0000 mg | Freq: Four times a day (QID) | INTRAMUSCULAR | Status: AC
  Administered 2015-07-08 – 2015-07-09 (×3): 10 mg via INTRAVENOUS
  Filled 2015-07-08 (×3): qty 2

## 2015-07-08 NOTE — Progress Notes (Signed)
2 Days Post-Op Procedure(s) (LRB): RIGHT VIDEO ASSISTED THORACOSCOPY (VATS) WITH PLACEMENT OF ON-Q PAIN PUMP (Right) RIGHT PNEUMONECTOMY (Right) Subjective: C/o incisional pain this AM  Objective: Vital signs in last 24 hours: Temp:  [97.9 F (36.6 C)-99.4 F (37.4 C)] 98.2 F (36.8 C) (12/09 0354) Pulse Rate:  [70-89] 81 (12/09 0700) Cardiac Rhythm:  [-] Normal sinus rhythm (12/09 0400) Resp:  [15-27] 23 (12/09 0700) BP: (92-151)/(45-93) 137/88 mmHg (12/09 0700) SpO2:  [91 %-100 %] 98 % (12/09 0700) Arterial Line BP: (174)/(83) 174/83 mmHg (12/08 0800) Weight:  [197 lb 15.6 oz (89.8 kg)] 197 lb 15.6 oz (89.8 kg) (12/08 0800)  Hemodynamic parameters for last 24 hours:    Intake/Output from previous day: 12/08 0701 - 12/09 0700 In: 428.7 [I.V.:378.7; IV Piggyback:50] Out: 3055 [Urine:3055] Intake/Output this shift:    General appearance: alert, cooperative and no distress Neurologic: intact Heart: regular rate and rhythm Lungs: clear on left, absent on right Abdomen: mildly distended, nontender, + BS Wound: clean and dry with surrounding ecchymosis  Lab Results:  Recent Labs  07/07/15 0335 07/08/15 0405  WBC 15.3* 12.8*  HGB 12.8* 13.0  HCT 39.5 39.8  PLT 173 174   BMET:  Recent Labs  07/07/15 0335 07/08/15 0405  NA 137 136  K 4.5 4.0  CL 106 101  CO2 26 31  GLUCOSE 129* 118*  BUN 10 12  CREATININE 0.96 1.15  CALCIUM 8.1* 8.3*    PT/INR: No results for input(s): LABPROT, INR in the last 72 hours. ABG    Component Value Date/Time   PHART 7.447 07/07/2015 0604   HCO3 29.7* 07/07/2015 0604   TCO2 31 07/07/2015 0604   ACIDBASEDEF 6.0* 07/06/2015 1806   O2SAT 99.0 07/07/2015 0604   CBG (last 3)  No results for input(s): GLUCAP in the last 72 hours.  Assessment/Plan: S/P Procedure(s) (LRB): RIGHT VIDEO ASSISTED THORACOSCOPY (VATS) WITH PLACEMENT OF ON-Q PAIN PUMP (Right) RIGHT PNEUMONECTOMY (Right)  Plan for transfer to step-down: see transfer  orders   POD # 2 - overall doing well  CV- stable  RESP- s/p pneumonectomy. CXR shows normal progression, left lung clear  Continue IS, nebs PRN  RENAL- creatinine and lytes ok, diuresed well yesterday  Dc Foley  ENDO- CBG Ok  DVT prophylaxis- history of DVT in 2012. Continue SCD + enoxaparin  GI- tolerating POs. CXR this AM shows some gastric dilatation- will give reglan x 24 hours  Ambulating well  Transition to PO pain meds   LOS: 2 days    Melrose Nakayama 07/08/2015

## 2015-07-08 NOTE — Progress Notes (Signed)
Utilization Review Completed.  

## 2015-07-08 NOTE — Progress Notes (Signed)
Wasted fentanyl PCA 42m in sink.  Witnessed by NWarden FillersRN.

## 2015-07-08 NOTE — Progress Notes (Signed)
Patient ID: Brett Holland, male   DOB: 01-08-1956, 59 y.o.   MRN: [id number] EVENING ROUNDS NOTE :     [address]       [place] [address]             [phone]                 2 Days Post-Op Procedure(s) (LRB): RIGHT VIDEO ASSISTED THORACOSCOPY (VATS) WITH PLACEMENT OF ON-Q PAIN PUMP (Right) RIGHT PNEUMONECTOMY (Right)  Total Length of Stay:  LOS: 2 days  BP 140/128 mmHg  Pulse 96  Temp(Src) 98 F (36.7 C) (Oral)  Resp 18  Ht '5\' 10"'$  (1.778 m)  Wt 197 lb 15.6 oz (89.8 kg)  BMI 28.41 kg/m2  SpO2 96%  .Intake/Output      12/08 0701 - 12/09 0700 12/09 0701 - 12/10 0700   P.O.  1400   I.V. (mL/kg) 388.7 (4.3) 30 (0.3)   IV Piggyback 50    Total Intake(mL/kg) 438.7 (4.9) 1430 (15.9)   Urine (mL/kg/hr) 3055 (1.4) 920 (1)   Blood     Chest Tube     Total Output 3055 920   Net -2616.3 +510          . bupivacaine ON-Q pain pump       Lab Results  Component Value Date   WBC 12.8* 07/08/2015   HGB 13.0 07/08/2015   HCT 39.8 07/08/2015   PLT 174 07/08/2015   GLUCOSE 118* 07/08/2015   ALT 19 07/08/2015   AST 27 07/08/2015   NA 136 07/08/2015   K 4.0 07/08/2015   CL 101 07/08/2015   CREATININE 1.15 07/08/2015   BUN 12 07/08/2015   CO2 31 07/08/2015   INR 1.01 06/30/2015    Stable, waiting for step down bed  Grace Isaac MD  Beeper [phone] Office [phone] 07/08/2015 5:20 PM

## 2015-07-09 ENCOUNTER — Inpatient Hospital Stay (HOSPITAL_COMMUNITY): Payer: Managed Care, Other (non HMO)

## 2015-07-09 MED ORDER — HYDROCHLOROTHIAZIDE 12.5 MG PO CAPS
12.5000 mg | ORAL_CAPSULE | Freq: Every day | ORAL | Status: DC
  Administered 2015-07-09: 12.5 mg via ORAL
  Filled 2015-07-09 (×2): qty 1

## 2015-07-09 NOTE — Progress Notes (Addendum)
[address]       [place] [address]             [phone]      3 Days Post-Op Procedure(s) (LRB): RIGHT VIDEO ASSISTED THORACOSCOPY (VATS) WITH PLACEMENT OF ON-Q PAIN PUMP (Right) RIGHT PNEUMONECTOMY (Right) Subjective: Feels pretty well overall, not SOB, pain controlled pretty well  Objective: Vital signs in last 24 hours: Temp:  [98 F (36.7 C)-98.6 F (37 C)] 98.2 F (36.8 C) (12/10 0424) Pulse Rate:  [77-101] 77 (12/10 0424) Cardiac Rhythm:  [-] Normal sinus rhythm (12/10 0700) Resp:  [16-23] 17 (12/10 0424) BP: (130-152)/(83-128) 148/84 mmHg (12/10 0424) SpO2:  [94 %-98 %] 95 % (12/10 0424) Weight:  [203 lb (92.08 kg)] 203 lb (92.08 kg) (12/10 0424)  Hemodynamic parameters for last 24 hours:    Intake/Output from previous day: 12/09 0701 - 12/10 0700 In: 1430 [P.O.:1400; I.V.:30] Out: 2220 [Urine:2220] Intake/Output this shift: Total I/O In: -  Out: 240 [Urine:240]  General appearance: alert, cooperative and no distress Heart: regular rate and rhythm Lungs: fairly clear throughout left Abdomen: benign Extremities: minor edema Wound: incis healing well  Lab Results:  Recent Labs  07/07/15 0335 07/08/15 0405  WBC 15.3* 12.8*  HGB 12.8* 13.0  HCT 39.5 39.8  PLT 173 174   BMET:  Recent Labs  07/07/15 0335 07/08/15 0405  NA 137 136  K 4.5 4.0  CL 106 101  CO2 26 31  GLUCOSE 129* 118*  BUN 10 12  CREATININE 0.96 1.15  CALCIUM 8.1* 8.3*    PT/INR: No results for input(s): LABPROT, INR in the last 72 hours. ABG    Component Value Date/Time   PHART 7.447 07/07/2015 0604   HCO3 29.7* 07/07/2015 0604   TCO2 31 07/07/2015 0604   ACIDBASEDEF 6.0* 07/06/2015 1806   O2SAT 99.0 07/07/2015 0604   CBG (last 3)  No results for input(s): GLUCAP in the last 72 hours.  Meds Scheduled Meds: . acetaminophen  1,000 mg Oral 4 times per day   Or  . acetaminophen (TYLENOL) oral liquid 160 mg/5 mL  1,000 mg Oral 4 times per  day  . bisacodyl  10 mg Oral Daily  . enoxaparin (LOVENOX) injection  40 mg Subcutaneous Q24H  . famotidine  10 mg Oral BID  . fluticasone  1 spray Each Nare Daily  . loratadine  10 mg Oral Daily  . senna-docusate  1 tablet Oral QHS   Continuous Infusions: . bupivacaine ON-Q pain pump     PRN Meds:.diphenhydrAMINE **OR** diphenhydrAMINE, ketorolac, levalbuterol, naloxone **AND** sodium chloride, ondansetron (ZOFRAN) IV, oxyCODONE, [START ON 07/10/2015] pneumococcal 23 valent vaccine, potassium chloride, traMADol  Xrays Dg Chest 2 View  07/09/2015  CLINICAL DATA:  Status post RIGHT pneumonectomy EXAM: CHEST  2 VIEW COMPARISON:  07/08/2015 FINDINGS: Interval removal of RIGHT central venous line. Patient status post RIGHT pneumonectomy 07/06/2015. Fluid occupies 2/3 of the RIGHT hemi thorax volume with air-fluid level. LEFT lung is clear. IMPRESSION: Fluid filled RIGHT hemi thorax following pneumonectomy. LEFT lung is clear. Electronically Signed   By: Suzy Bouchard M.D.   On: 07/09/2015 09:18   Dg Chest Port 1 View  07/08/2015  CLINICAL DATA:  Post right pneumonectomy. EXAM: PORTABLE CHEST 1 VIEW COMPARISON:  07/07/2015 FINDINGS: Again noted is subcutaneous gas throughout the right chest. Right chest tube was removed. There continues to be air throughout the right hemithorax but there are new densities at the right chest  base. Left lung remains clear. Heart and mediastinum are stable. Right jugular central line tip in the lower SVC region and stable. IMPRESSION: Removal of the right chest tube with increased densities in the right lower chest. This could represent some accumulation of fluid. Left lung remains clear. Electronically Signed   By: Markus Daft M.D.   On: 07/08/2015 07:59    Assessment/Plan: S/P Procedure(s) (LRB): RIGHT VIDEO ASSISTED THORACOSCOPY (VATS) WITH PLACEMENT OF ON-Q PAIN PUMP (Right) RIGHT PNEUMONECTOMY (Right)  1 good overall progress 2 cont pulm toilet and  rehab 3 he is hypertensive- on no meds preop but has history of it, will start some HCTZ 4 will get am labs  LOS: 3 days    GOLD,WAYNE E 07/09/2015  Chest xray with increasing right effusion as expected Patient feels well , good respiratory effort I have seen and examined Wess Botts and agree with the above assessment  and plan.  Grace Isaac MD Beeper [phone] Office [phone] 07/09/2015 12:01 PM

## 2015-07-10 LAB — BASIC METABOLIC PANEL
ANION GAP: 10 (ref 5–15)
BUN: 15 mg/dL (ref 6–20)
CALCIUM: 8.9 mg/dL (ref 8.9–10.3)
CO2: 26 mmol/L (ref 22–32)
Chloride: 99 mmol/L — ABNORMAL LOW (ref 101–111)
Creatinine, Ser: 1.15 mg/dL (ref 0.61–1.24)
GFR calc Af Amer: >60 mL/min (ref >60)
GLUCOSE: 106 mg/dL — AB (ref 65–99)
Potassium: 4.5 mmol/L (ref 3.5–5.1)
Sodium: 135 mmol/L (ref 135–145)

## 2015-07-10 LAB — HEPATIC FUNCTION PANEL
ALT: 20 U/L (ref 17–63)
AST: 27 U/L (ref 15–41)
Albumin: 3 g/dL — ABNORMAL LOW (ref 3.5–5.0)
Alkaline Phosphatase: 55 U/L (ref 38–126)
BILIRUBIN DIRECT: 0.2 mg/dL (ref 0.1–0.5)
BILIRUBIN INDIRECT: 0.9 mg/dL (ref 0.3–0.9)
Total Bilirubin: 1.1 mg/dL (ref 0.3–1.2)
Total Protein: 6.3 g/dL — ABNORMAL LOW (ref 6.5–8.1)

## 2015-07-10 LAB — CBC
HCT: 44 % (ref 39.0–52.0)
Hemoglobin: 14.3 g/dL (ref 13.0–17.0)
MCH: 29.2 pg (ref 26.0–34.0)
MCHC: 32.5 g/dL (ref 30.0–36.0)
MCV: 89.8 fL (ref 78.0–100.0)
PLATELETS: 222 10*3/uL (ref 150–400)
RBC: 4.9 MIL/uL (ref 4.22–5.81)
RDW: 13.4 % (ref 11.5–15.5)
WBC: 15 10*3/uL — AB (ref 4.0–10.5)

## 2015-07-10 LAB — AMYLASE: AMYLASE: 35 U/L (ref 28–100)

## 2015-07-10 MED ORDER — PROMETHAZINE HCL 25 MG/ML IJ SOLN
12.5000 mg | Freq: Four times a day (QID) | INTRAMUSCULAR | Status: DC | PRN
  Administered 2015-07-10: 12.5 mg via INTRAVENOUS
  Filled 2015-07-10 (×2): qty 1

## 2015-07-10 MED ORDER — AMLODIPINE BESYLATE 5 MG PO TABS
5.0000 mg | ORAL_TABLET | Freq: Every day | ORAL | Status: DC
  Administered 2015-07-10: 5 mg via ORAL
  Filled 2015-07-10: qty 1

## 2015-07-10 NOTE — Plan of Care (Signed)
Problem: Phase III Progression Outcomes Goal: Pain controlled on oral analgesia Pt able to ambulate to BR by self  Comments:  Pt had nausea throughout the night, MD aware medication changed.

## 2015-07-10 NOTE — Progress Notes (Signed)
Removed On Q pump. Patient tolerated removal well. Wire was intact. Will monitor.  Cyndia Bent

## 2015-07-10 NOTE — Progress Notes (Addendum)
[address]       [place] [address]             [phone]      4 Days Post-Op Procedure(s) (LRB): RIGHT VIDEO ASSISTED THORACOSCOPY (VATS) WITH PLACEMENT OF ON-Q PAIN PUMP (Right) RIGHT PNEUMONECTOMY (Right) Subjective: Nausea and vomiting overnight, +  BM this am  Objective: Vital signs in last 24 hours: Temp:  [97.8 F (36.6 C)-98.5 F (36.9 C)] 98 F (36.7 C) (12/11 0558) Pulse Rate:  [86-98] 98 (12/11 0558) Cardiac Rhythm:  [-] Normal sinus rhythm (12/11 0703) Resp:  [18-19] 18 (12/11 0558) BP: (153-163)/(88-96) 153/88 mmHg (12/11 0558) SpO2:  [94 %-100 %] 100 % (12/11 0558)  Hemodynamic parameters for last 24 hours:    Intake/Output from previous day: 12/10 0701 - 12/11 0700 In: 360 [P.O.:360] Out: 640 [Urine:640] Intake/Output this shift: Total I/O In: -  Out: 200 [Urine:200]  General appearance: alert, cooperative and no distress Heart: regular rate and rhythm Lungs: clear on left Abdomen: Scant BS, soft, mild distension, nontender Extremities: no edema Wound: incis healing well  Lab Results:  Recent Labs  07/08/15 0405 07/10/15 0303  WBC 12.8* 15.0*  HGB 13.0 14.3  HCT 39.8 44.0  PLT 174 222   BMET:  Recent Labs  07/08/15 0405 07/10/15 0303  NA 136 135  K 4.0 4.5  CL 101 99*  CO2 31 26  GLUCOSE 118* 106*  BUN 12 15  CREATININE 1.15 1.15  CALCIUM 8.3* 8.9    PT/INR: No results for input(s): LABPROT, INR in the last 72 hours. ABG    Component Value Date/Time   PHART 7.447 07/07/2015 0604   HCO3 29.7* 07/07/2015 0604   TCO2 31 07/07/2015 0604   ACIDBASEDEF 6.0* 07/06/2015 1806   O2SAT 99.0 07/07/2015 0604   CBG (last 3)  No results for input(s): GLUCAP in the last 72 hours.  Meds Scheduled Meds: . acetaminophen  1,000 mg Oral 4 times per day   Or  . acetaminophen (TYLENOL) oral liquid 160 mg/5 mL  1,000 mg Oral 4 times per day  . bisacodyl  10 mg Oral Daily  . enoxaparin (LOVENOX) injection  40 mg  Subcutaneous Q24H  . famotidine  10 mg Oral BID  . fluticasone  1 spray Each Nare Daily  . hydrochlorothiazide  12.5 mg Oral Daily  . loratadine  10 mg Oral Daily  . senna-docusate  1 tablet Oral QHS   Continuous Infusions:  PRN Meds:.diphenhydrAMINE **OR** diphenhydrAMINE, levalbuterol, naloxone **AND** sodium chloride, ondansetron (ZOFRAN) IV, oxyCODONE, pneumococcal 23 valent vaccine, potassium chloride, promethazine, traMADol  Xrays Dg Chest 2 View  07/09/2015  CLINICAL DATA:  Status post RIGHT pneumonectomy EXAM: CHEST  2 VIEW COMPARISON:  07/08/2015 FINDINGS: Interval removal of RIGHT central venous line. Patient status post RIGHT pneumonectomy 07/06/2015. Fluid occupies 2/3 of the RIGHT hemi thorax volume with air-fluid level. LEFT lung is clear. IMPRESSION: Fluid filled RIGHT hemi thorax following pneumonectomy. LEFT lung is clear. Electronically Signed   By: Suzy Bouchard M.D.   On: 07/09/2015 09:18    Assessment/Plan: S/P Procedure(s) (LRB): RIGHT VIDEO ASSISTED THORACOSCOPY (VATS) WITH PLACEMENT OF ON-Q PAIN PUMP (Right) RIGHT PNEUMONECTOMY (Right)  1 probable partial ileus, limit narcs as able, he is not taking much. Increase ambulation, zofran prn 2 remains hypertensive, will d/c HCTZ as could have caused nausea /vomit, try norvasc 3 push pulm toilet/rehab as able 4 increased leukocytosis, no fevers, repeat with diff in am.  Also check LFT's and  amylase    LOS: 4 days    GOLD,WAYNE E 07/10/2015  D/c ONq  Today Fu chest xray in am Patient notes has become very sensitive to smells and associated nausea  Some hypertension, start low dose betablocker for bp and decrease risk of afib , will hold on norvasc I have seen and examined Brett Holland and agree with the above assessment  and plan.  Grace Isaac MD Beeper [phone] Office [phone] 07/10/2015 10:28 AM

## 2015-07-11 ENCOUNTER — Inpatient Hospital Stay (HOSPITAL_COMMUNITY): Payer: Managed Care, Other (non HMO)

## 2015-07-11 LAB — CBC WITH DIFFERENTIAL/PLATELET
BASOS ABS: 0 10*3/uL (ref 0.0–0.1)
BASOS PCT: 0 %
EOS ABS: 0.3 10*3/uL (ref 0.0–0.7)
EOS PCT: 3 %
HCT: 43 % (ref 39.0–52.0)
Hemoglobin: 14 g/dL (ref 13.0–17.0)
LYMPHS ABS: 1.2 10*3/uL (ref 0.7–4.0)
Lymphocytes Relative: 11 %
MCH: 29 pg (ref 26.0–34.0)
MCHC: 32.6 g/dL (ref 30.0–36.0)
MCV: 89.2 fL (ref 78.0–100.0)
MONOS PCT: 12 %
Monocytes Absolute: 1.4 10*3/uL — ABNORMAL HIGH (ref 0.1–1.0)
NEUTROS PCT: 74 %
Neutro Abs: 8.4 10*3/uL — ABNORMAL HIGH (ref 1.7–7.7)
PLATELETS: 247 10*3/uL (ref 150–400)
RBC: 4.82 MIL/uL (ref 4.22–5.81)
RDW: 13.3 % (ref 11.5–15.5)
WBC: 11.3 10*3/uL — ABNORMAL HIGH (ref 4.0–10.5)

## 2015-07-11 LAB — TYPE AND SCREEN
ABO/RH(D): O POS
ANTIBODY SCREEN: NEGATIVE
UNIT DIVISION: 0
Unit division: 0

## 2015-07-11 MED ORDER — OXYCODONE HCL 5 MG PO TABS: 5.0000 mg | ORAL_TABLET | ORAL | Status: AC | PRN

## 2015-07-11 MED ORDER — INFLUENZA VAC SPLIT QUAD 0.5 ML IM SUSY: 0.5000 mL | PREFILLED_SYRINGE | Freq: Once | INTRAMUSCULAR | Status: DC

## 2015-07-11 MED ORDER — METOPROLOL TARTRATE 25 MG PO TABS: 12.5000 mg | ORAL_TABLET | Freq: Two times a day (BID) | ORAL | Status: AC

## 2015-07-11 MED ORDER — INFLUENZA VAC SPLIT QUAD 0.5 ML IM SUSY: 0.5000 mL | PREFILLED_SYRINGE | INTRAMUSCULAR | Status: DC

## 2015-07-11 MED ORDER — INFLUENZA VAC SPLIT QUAD 0.5 ML IM SUSY
0.5000 mL | PREFILLED_SYRINGE | INTRAMUSCULAR | Status: DC | PRN
Start: 2015-07-11 — End: 2015-07-11

## 2015-07-11 MED ORDER — ASPIRIN 81 MG PO CHEW
81.0000 mg | CHEWABLE_TABLET | Freq: Every day | ORAL | Status: AC
Start: 2015-07-11 — End: ?

## 2015-07-11 MED ORDER — TRAMADOL HCL 50 MG PO TABS: 50.0000 mg | ORAL_TABLET | Freq: Four times a day (QID) | ORAL | Status: DC | PRN

## 2015-07-11 MED ORDER — ASPIRIN 81 MG PO CHEW
81.0000 mg | CHEWABLE_TABLET | Freq: Every day | ORAL | Status: DC
  Administered 2015-07-11: 81 mg via ORAL
  Filled 2015-07-11: qty 1

## 2015-07-11 MED ORDER — PNEUMOCOCCAL VAC POLYVALENT 25 MCG/0.5ML IJ INJ: 0.5000 mL | INJECTION | Freq: Once | INTRAMUSCULAR | Status: DC

## 2015-07-11 MED ORDER — METOPROLOL TARTRATE 12.5 MG HALF TABLET
12.5000 mg | ORAL_TABLET | Freq: Two times a day (BID) | ORAL | Status: DC
  Administered 2015-07-11: 12.5 mg via ORAL
  Filled 2015-07-11: qty 1

## 2015-07-11 NOTE — Progress Notes (Addendum)
Pt in stable condition, IV lines taken out, Cardiac monitor DC, CCMD notified, pt belongings at bedside, paper prescriptions and discharge instructions given to pt, wnet over the discharge instructions with pt pt verbalises understanding Pt taken off the unit on wheelchair by an NT. Brett Beckmann RN

## 2015-07-11 NOTE — Discharge Instructions (Signed)
°  Refer to this sheet in the next few weeks. These instructions provide you with information on caring for yourself after your procedure. Your caregiver may also give you more specific instructions. Your procedure has been planned according to current medical practices, but problems sometimes occur. Call your caregiver if you have any problems or questions after your procedure. HOME CARE INSTRUCTIONS   Only take over-the-counter or prescription medications as directed.  Only take pain medications (narcotics) as directed.  Do not drive until your caregiver approves. Driving while taking narcotics or soon after surgery can be dangerous, so discuss the specific timing with your caregiver.  Avoid activities that use your chest muscles, such as lifting heavy objects, for at least 3-4 weeks.   Take deep breaths to expand the lungs and to protect against pneumonia.  Do breathing exercises as directed by your caregiver. If you were given an incentive spirometer to help with breathing, use it as directed.  You may resume a normal diet and activities when you feel you are able to or as directed.  Do not take a bath until your caregiver says it is OK. Use the shower instead.   Keep the bandage (dressing) covering the area where the chest tube was inserted (incision site) dry for 48 hours. After 48 hours, remove the dressing unless there is new drainage.  Remove dressings as directed by your caregiver.  Change dressings if necessary or as directed.  Keep all follow-up appointments. It is important for you to see your caregiver after surgery to discuss appropriate follow-up care and surveillance, if it is necessary. SEEK MEDICAL CARE:  You feel excessive or increasing pain at an incision site.  You notice bleeding, skin irritation, drainage, swelling, or redness at an incision site.  There is a bad smell coming from an incision or dressing.  It feels like your heart is fluttering or beating  rapidly.  Your pain medication does not relieve your pain. SEEK IMMEDIATE MEDICAL CARE IF:   You have a fever.   You have chest pain.  You have a rash.  You have shortness of breath.  You have trouble breathing.   You feel weak, lightheaded, dizzy, or faint.  MAKE SURE YOU:   Understand these instructions.   Will watch your condition.   Will get help right away if you are not doing well or get worse.    Wear compression stockings daily  This information is not intended to replace advice given to you by your health care provider. Make sure you discuss any questions you have with your health care provider.   Document Released: 11/10/2012 Document Revised: 08/06/2014 Document Reviewed: 11/10/2012 Elsevier Interactive Patient Education [employer].

## 2015-07-11 NOTE — Progress Notes (Addendum)
      [address]       [place] [address]             [phone]       5 Days Post-Op Procedure(s) (LRB): RIGHT VIDEO ASSISTED THORACOSCOPY (VATS) WITH PLACEMENT OF ON-Q PAIN PUMP (Right) RIGHT PNEUMONECTOMY (Right)  Subjective: Patient states less nausea this am  Objective: Vital signs in last 24 hours: Temp:  [98.2 F (36.8 C)-99.1 F (37.3 C)] 98.2 F (36.8 C) (12/12 0517) Pulse Rate:  [94-108] 94 (12/12 0517) Cardiac Rhythm:  [-] Normal sinus rhythm (12/11 2025) Resp:  [18] 18 (12/12 0517) BP: (126-147)/(88-91) 126/88 mmHg (12/12 0517) SpO2:  [97 %-98 %] 98 % (12/12 0517)      Intake/Output from previous day: 12/11 0701 - 12/12 0700 In: 180 [P.O.:180] Out: 775 [Urine:775]   Physical Exam:  Cardiovascular: Slightly tachy  Pulmonary: Clear on left Abdomen: Soft, non tender, bowel sounds present. Extremities: No lower extremity edema. Wounds: Clean and dry.  No erythema or signs of infection.   Lab Results: CBC: Recent Labs  07/10/15 0303 07/11/15 0210  WBC 15.0* 11.3*  HGB 14.3 14.0  HCT 44.0 43.0  PLT 222 247   BMET:  Recent Labs  07/10/15 0303  NA 135  K 4.5  CL 99*  CO2 26  GLUCOSE 106*  BUN 15  CREATININE 1.15  CALCIUM 8.9    PT/INR: No results for input(s): LABPROT, INR in the last 72 hours. ABG:  INR: Will add last result for INR, ABG once components are confirmed Will add last 4 CBG results once components are confirmed  Assessment/Plan:  1. CV - SR/ST. Per Dr. Servando Snare yesterday, will start low dose Lopressor. 2.  Pulmonary - On room air. CXR this am is stable (persistent air fluid level s/p right pneumonectomy and left lung is clear). 3. Nausea/emesis yesterday. Patient states sensitive to smells as has no abdominal pain. LFTs "WNL". 4. Hope to discharge soon  ZIMMERMAN,DONIELLE MPA-C 07/11/2015,7:52 AM Patient seen and examined, agree with above He feels better today- will dc home He will take an  aspirin a day and wear compression stockings once home  April Colter C. Roxan Hockey, MD [facility] [phone]

## 2015-07-11 NOTE — Care Management Note (Signed)
Case Management Note  Patient Details  Name: KARSIN PESTA MRN: [id number] Date of Birth: 1956/05/18  Subjective/Objective:     Pt is s/p pnemonectomy               Action/Plan:  Pt is independent from home with wife   Expected Discharge Date:                  Expected Discharge Plan:  Home/Self Care  In-House Referral:     Discharge planning Services  CM Consult  Post Acute Care Choice:    Choice offered to:     DME Arranged:    DME Agency:    C HH Arranged:    [place]:     Status of Service:  Completed, signed off  Medicare Important Message Given:    Date Medicare IM Given:    Medicare IM give by:    Date Additional Medicare IM Given:    Additional Medicare Important Message give by:     If discussed at [place] of Stay Meetings, dates discussed:    Additional Comments: CM assessed pt prior to discharge; pt will return home with wife, No CM needs determined Maryclare Labrador, RN 07/11/2015, 11:03 AM

## 2015-07-11 NOTE — Discharge Summary (Signed)
Physician Discharge Summary       [address]       [place] [address]             [phone]    Patient ID: Brett Holland MRN: [id number] DOB/AGE: 1956-04-15 59 y.o.  Admit date: 07/06/2015 Discharge date: 07/11/2015  Admission Diagnoses: Right lower lobe nodule  Discharge Diagnoses:  Non-small cell cancer of right lung [facility])   Procedure (s):  Right video-assisted thoracoscopy, right pneumonectomy, mediastinal lymph node dissection, On-Q local anesthetic catheter placement by Dr. Roxan Hockey on 07/06/2015.  Pathology: Final results pending  History of Presenting Illness: This is a 59 year old man who had a DVT back in 2012. A CT of the chest showed a right lower lobe nodule in close proximity to the hilum. Given his recent DVT at that time, we elected to follow the nodule rather than operate. We have followed him with scans since then. Initially, the nodule change very little. At his visit a year ago the nodule had grown a fair amount. 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 We discussed resection versus continued follow-up and decided to get another scan a year.  He's been doing well. He has not had any significant health issues over the past year. He does exercise on a regular basis. He has not had any significant cough. He denies wheezing or flushing.  At his most recent visit in October, the nodule showed further growth. Dr. Roxan Hockey recommended to go ahead and resect the nodule. He was in agreement with that, but wanted to wait until sometime next year to do so. In the interim, he has changed his mind and would like to go ahead and have the nodule resected the soon as possible.  Dr. Roxan Hockey discussed the need for resection of right lung nodule, possible lobectomy. Potential risks, benefits, and complications of the surgery were discussed with the patient and he agreed to proceed with surgery.     Brief Hospital Course:  He remained afebrile and hemodynamically stable. His a line and foley were removed early in his post operative course. Daily chest x rays were obtained and remained stable. He did have gastric dilatation on chest x ray on post operative day 2. He was given Reglan. He was felt surgically stable for transfer from the ICU to 2000 for further convalescence. He was hypertensive and put on HCTZ. He then developed nausea and vomiting. It was felt he may have a partial ileus but HCTZ was stopped. He was also tachycardic. He was started on and tolerated Lopressor 12.5 mg bid. He later did have a bowel movement. The patient states he has become very sensitive to smells. He no longer had vomiting and his nausea did gradually improve. He is ambulating in room air. His wound is clean and dry and continuing to heal. Per Dr. Roxan Hockey, he to take a baby aspirin (enteric coated) daily and wear compression stockings.He is felt surgically stable for discharge today.  Latest Vital Signs: Blood pressure 126/88, pulse 94, temperature 98.2 F (36.8 C), temperature source Oral, resp. rate 18, height '5\' 10"'$  (1.778 m), weight 203 lb (92.08 kg), SpO2 98 %.  Physical Exam: Cardiovascular: Slightly tachy  Pulmonary: Clear on left Abdomen: Soft, non tender, bowel sounds present. Extremities: No lower extremity edema. Wounds: Clean and dry. No erythema or signs of infection.  Discharge Condition:Stable and discharged to home  Recent laboratory studies:  Lab Results  Component Value Date   WBC 11.3* 07/11/2015   HGB 14.0 07/11/2015   HCT 43.0 07/11/2015   MCV 89.2 07/11/2015   PLT 247 07/11/2015   Lab Results  Component Value Date   NA 135 07/10/2015   K 4.5 07/10/2015   CL 99* 07/10/2015   CO2 26 07/10/2015   CREATININE 1.15 07/10/2015   GLUCOSE 106* 07/10/2015    Diagnostic Studies: Dg Chest 2 View  07/11/2015  CLINICAL DATA:  Status post right pneumonectomy for lung  malignancy, history thrombophlebitis of the lower extremities EXAM: CHEST  2 VIEW COMPARISON:  Portable chest x-ray of July 09, 2015 FINDINGS: There remains a large air-fluid level in the right hemi thorax. Considerable subcutaneous emphysema is present on the right. There is mild mediastinal shift toward the right which is stable. The left lung is well-expanded and clear. There is no left pleural effusion or pneumothorax. The left heart border is normal. The pulmonary vascularity is not engorged. IMPRESSION: Persistent air in fluid level in the right hemi thorax status post right pneumonectomy. The left lung is clear. Electronically Signed   By: David  Martinique M.D.   On: 07/11/2015 07:32    Discharge Medications:   Medication List    TAKE these medications        aspirin 81 MG chewable tablet  Chew 1 tablet (81 mg total) by mouth daily.     famotidine 10 MG tablet  Commonly known as:  PEPCID  Take 10 mg by mouth 2 (two) times daily.     fluticasone 50 MCG/ACT nasal spray  Commonly known as:  FLONASE  Place 1 spray into the nose daily.     loratadine 10 MG tablet  Commonly known as:  CLARITIN  Take 10 mg by mouth daily.     metoprolol tartrate 25 MG tablet  Commonly known as:  LOPRESSOR  Take 0.5 tablets (12.5 mg total) by mouth 2 (two) times daily.     oxyCODONE 5 MG immediate release tablet  Commonly known as:  Oxy IR/ROXICODONE  Take 1 tablet (5 mg total) by mouth every 4 (four) hours as needed for severe pain.        Follow Up Appointments: Follow-up Information    Follow up with Melrose Nakayama, MD On 07/26/2015.   Specialty:  Cardiothoracic Surgery   Why:  PA/LAT CXR to be taken (at [facility] which is in the same building as Dr. Leonarda Salon office) on 07/26/2015 at 12:00 pm;Appointment with Dr. Roxan Hockey is at 12:45 pm   Contact information:   [place] [address] [phone]       Signed: Lars Pinks  MPA-C 07/11/2015, 10:54 AM

## 2015-07-13 NOTE — Anesthesia Postprocedure Evaluation (Signed)
Anesthesia Post Note  Patient: Brett Holland  Procedure(s) Performed: Procedure(s) (LRB): RIGHT VIDEO ASSISTED THORACOSCOPY (VATS) WITH PLACEMENT OF ON-Q PAIN PUMP (Right) RIGHT PNEUMONECTOMY (Right)  Patient location during evaluation: PACU Anesthesia Type: General Level of consciousness: awake and alert and sedated Pain management: pain level controlled Vital Signs Assessment: post-procedure vital signs reviewed and stable Respiratory status: spontaneous breathing, respiratory function stable and patient connected to nasal cannula oxygen Cardiovascular status: blood pressure returned to baseline and stable Postop Assessment: no signs of nausea or vomiting Anesthetic complications: no    Last Vitals:  Filed Vitals:   07/11/15 1138 07/11/15 1343  BP: 123/84 142/76  Pulse: 104 81  Temp: 37.1 C 36.6 C  Resp:  18    Last Pain:  Filed Vitals:   07/11/15 1356  PainSc: 5                  Lavan Imes,JAMES TERRILL

## 2015-07-26 ENCOUNTER — Ambulatory Visit: Payer: Managed Care, Other (non HMO) | Admitting: Thoracic Surgery (Cardiothoracic Vascular Surgery)

## 2015-07-31 DEATH — deceased

## 2016-02-17 NOTE — Progress Notes (Signed)
This encounter was created in error - please disregard.

## 2016-09-25 IMAGING — CR DG CHEST 2V
2 series · 2 of 2 positions shown · non-contrast
Comparison: 10/21/2013

CLINICAL DATA: Nodule of the right lung.

EXAM:
CHEST  2 VIEW

[w chest pa]
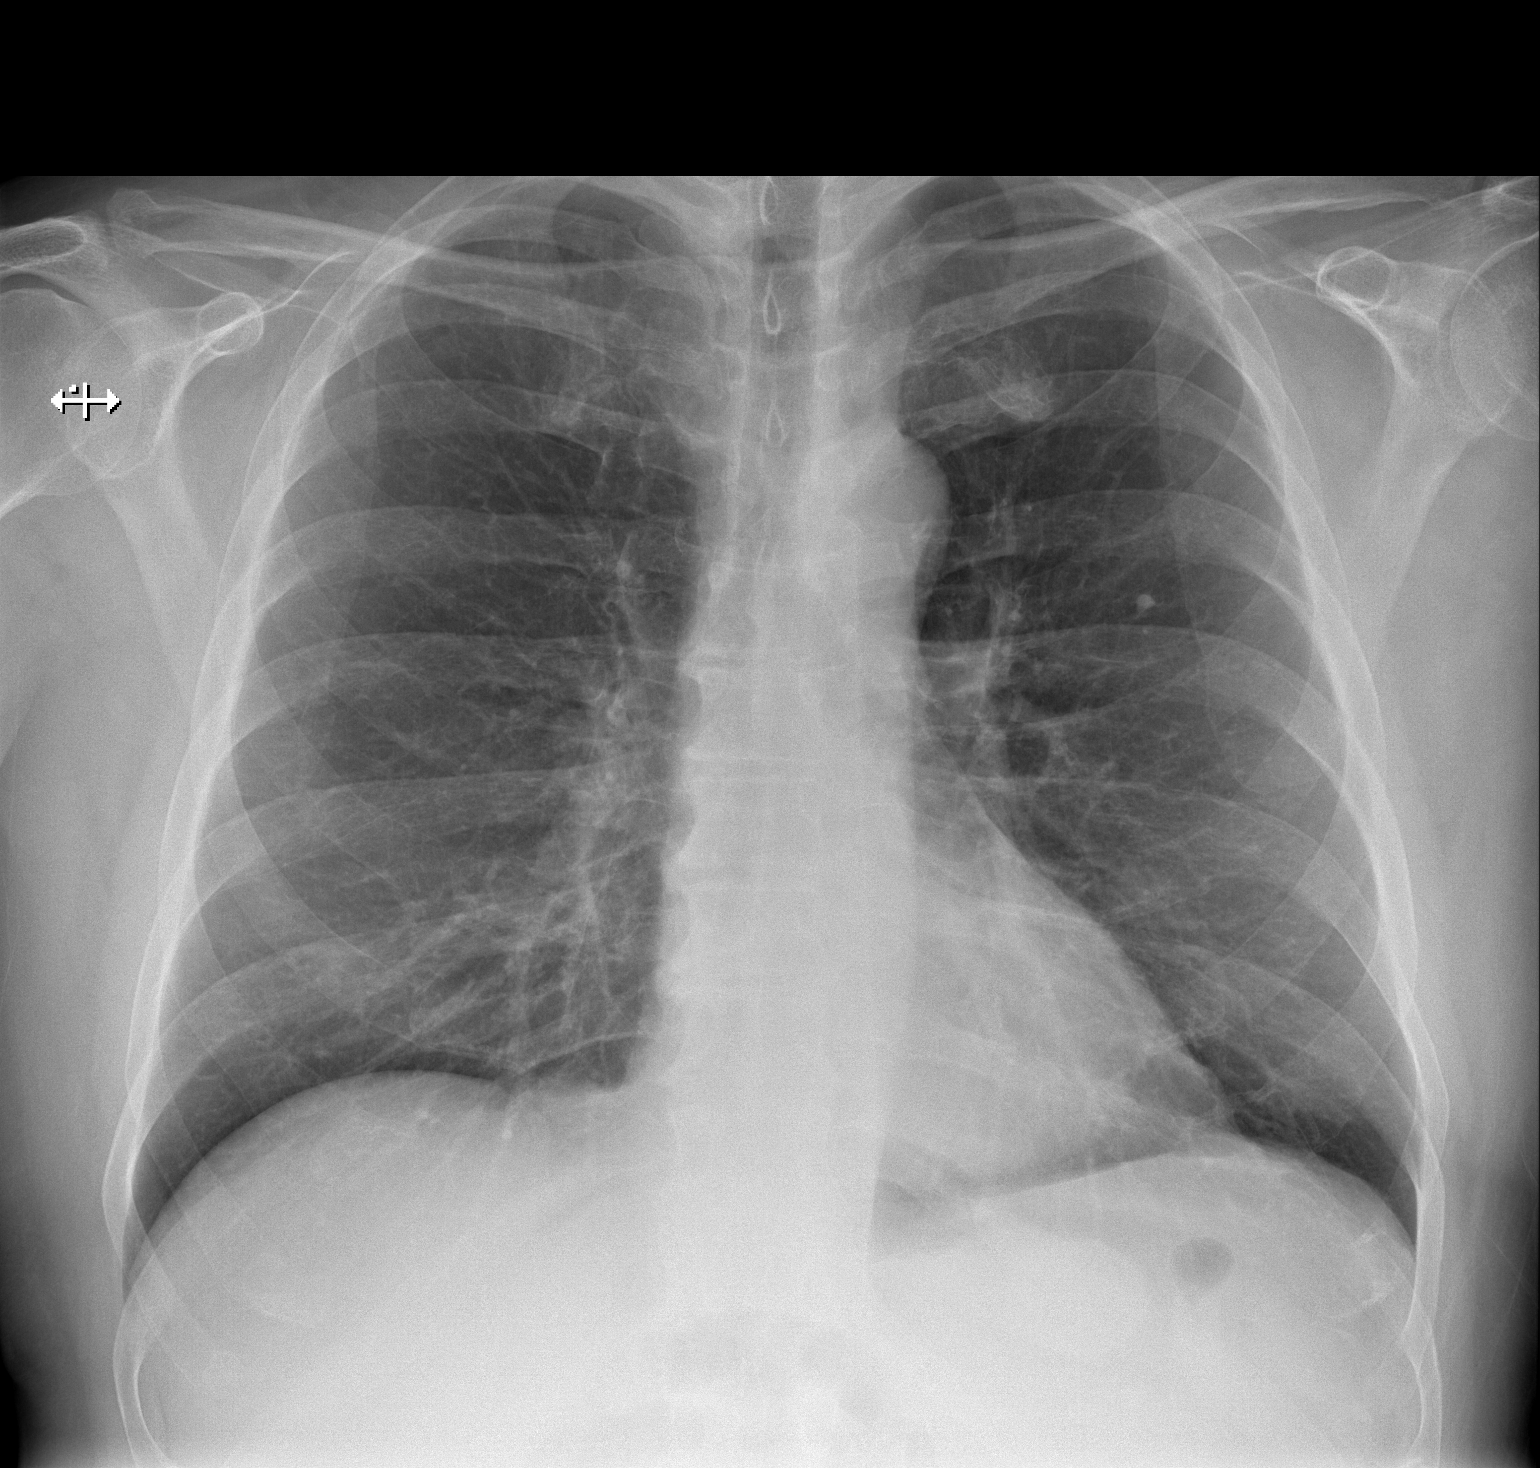

[w chest lat]
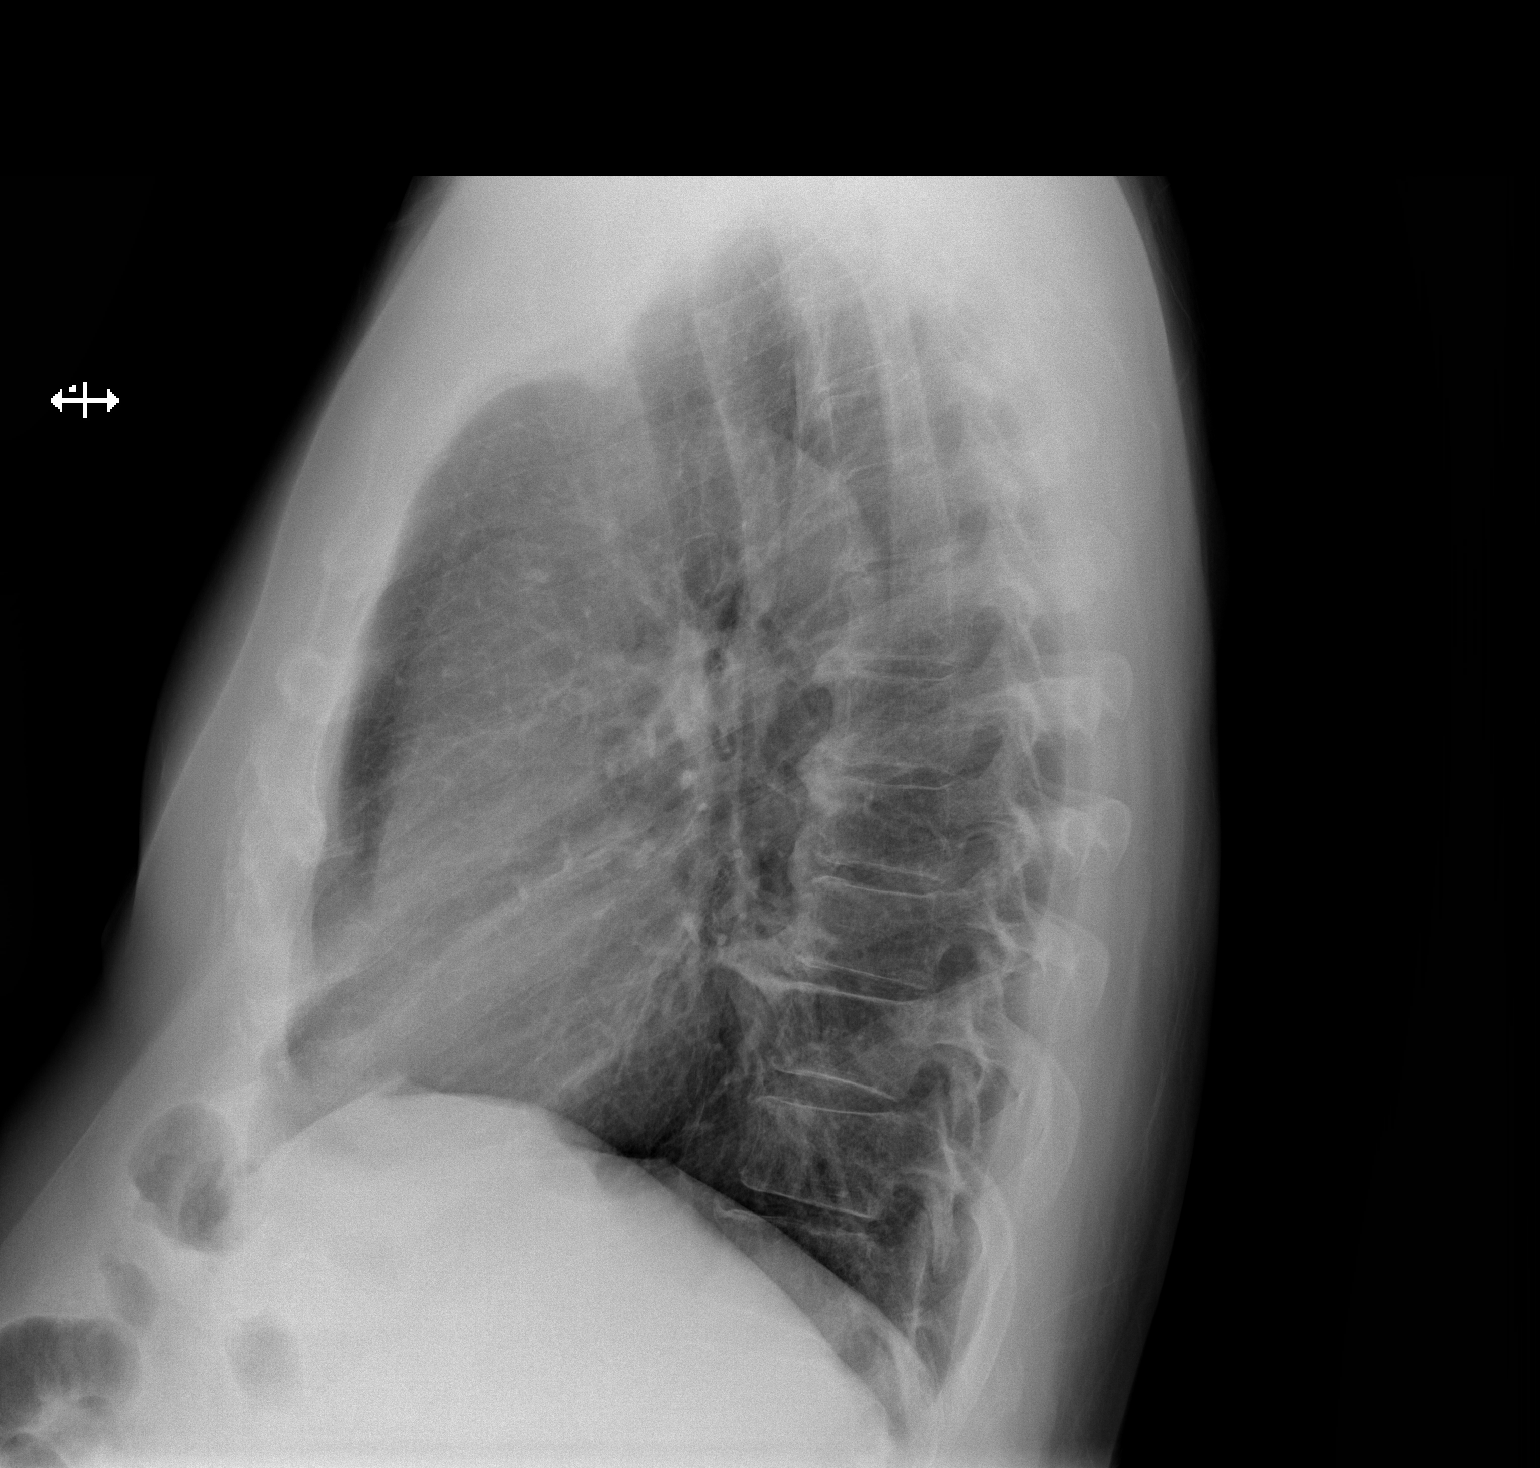

[2 of 2 positions shown; findings below may reference images not displayed]

FINDINGS: Normal heart size and mediastinal contours. Calcified granuloma in
the left lung. A right-sided pulmonary nodule seen on chest CT
05/17/2015 is not visualized confidently by radiography. No acute
infiltrate or edema. No effusion or pneumothorax. No acute osseous
findings.
IMPRESSION: No active cardiopulmonary disease or detectable change from chest CT
05/17/2015.

## 2016-09-25 IMAGING — CR DG CHEST 1V PORT
1 series · 1 of 1 positions shown · non-contrast
Comparison: 07/06/2015

CLINICAL DATA: Status post right VATS and central line placement

EXAM:
PORTABLE CHEST - 1 VIEW

[AP]
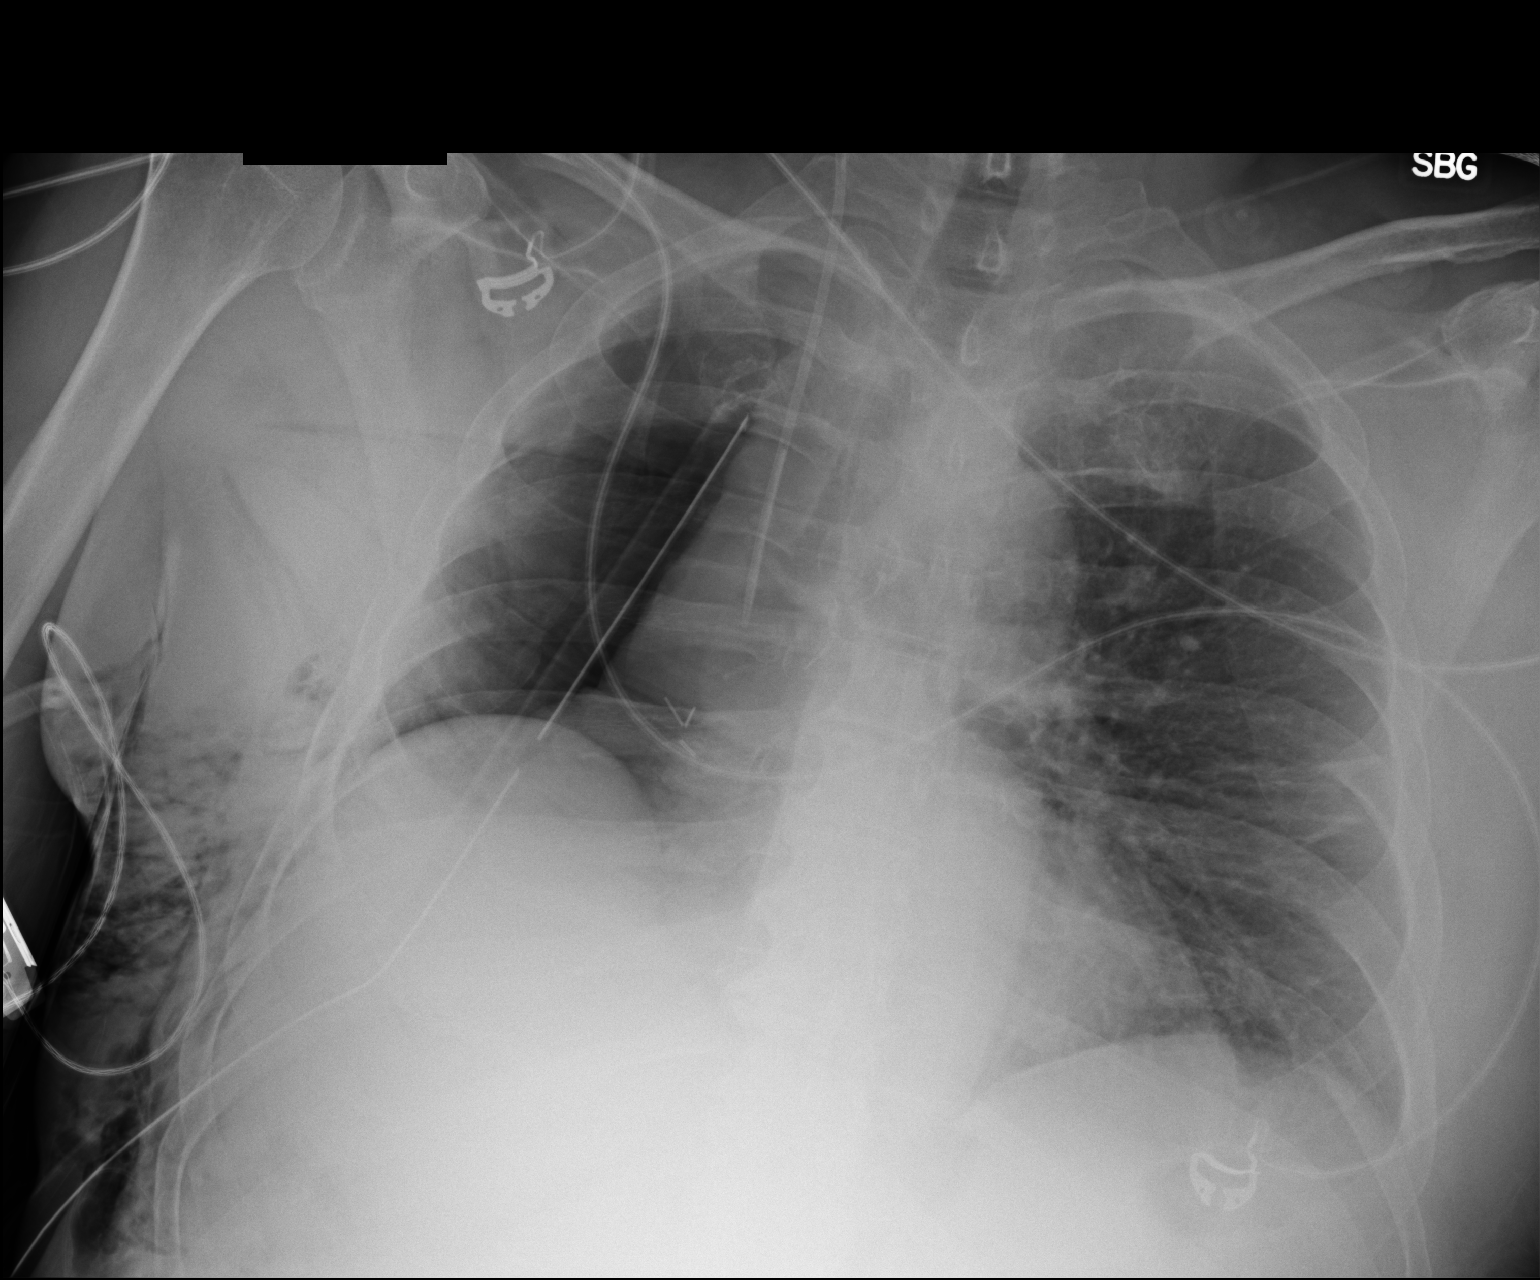

[1 of 1 positions shown; findings below may reference images not displayed]

FINDINGS: Cardiac shadow is stable. Postoperative changes are noted in the
right hemi thorax. A chest tube is noted in place on the right
although changes of complete pneumonectomy are noted. A right
jugular central line is noted with the catheter tip in the distal
superior vena cava. The left lung is clear with the exception of a
small calcified granuloma. No acute bony abnormality is noted.
IMPRESSION: Postoperative changes consistent with right pneumonectomy. A chest
tube is noted in place. A right central line is also noted in
satisfactory position.

These results were called by telephone at the time of interpretation
on 07/06/2015 at [DATE] to [place], the patients nurse in PACU, who
verbally acknowledged these results.

## 2016-09-26 IMAGING — CR DG CHEST 1V PORT
1 series · 1 of 1 positions shown · non-contrast
Comparison: Portable chest x-ray July 06, 2015

CLINICAL DATA: Right pneumonectomy, placement of a pain pump

EXAM:
PORTABLE CHEST 1 VIEW

[AP]
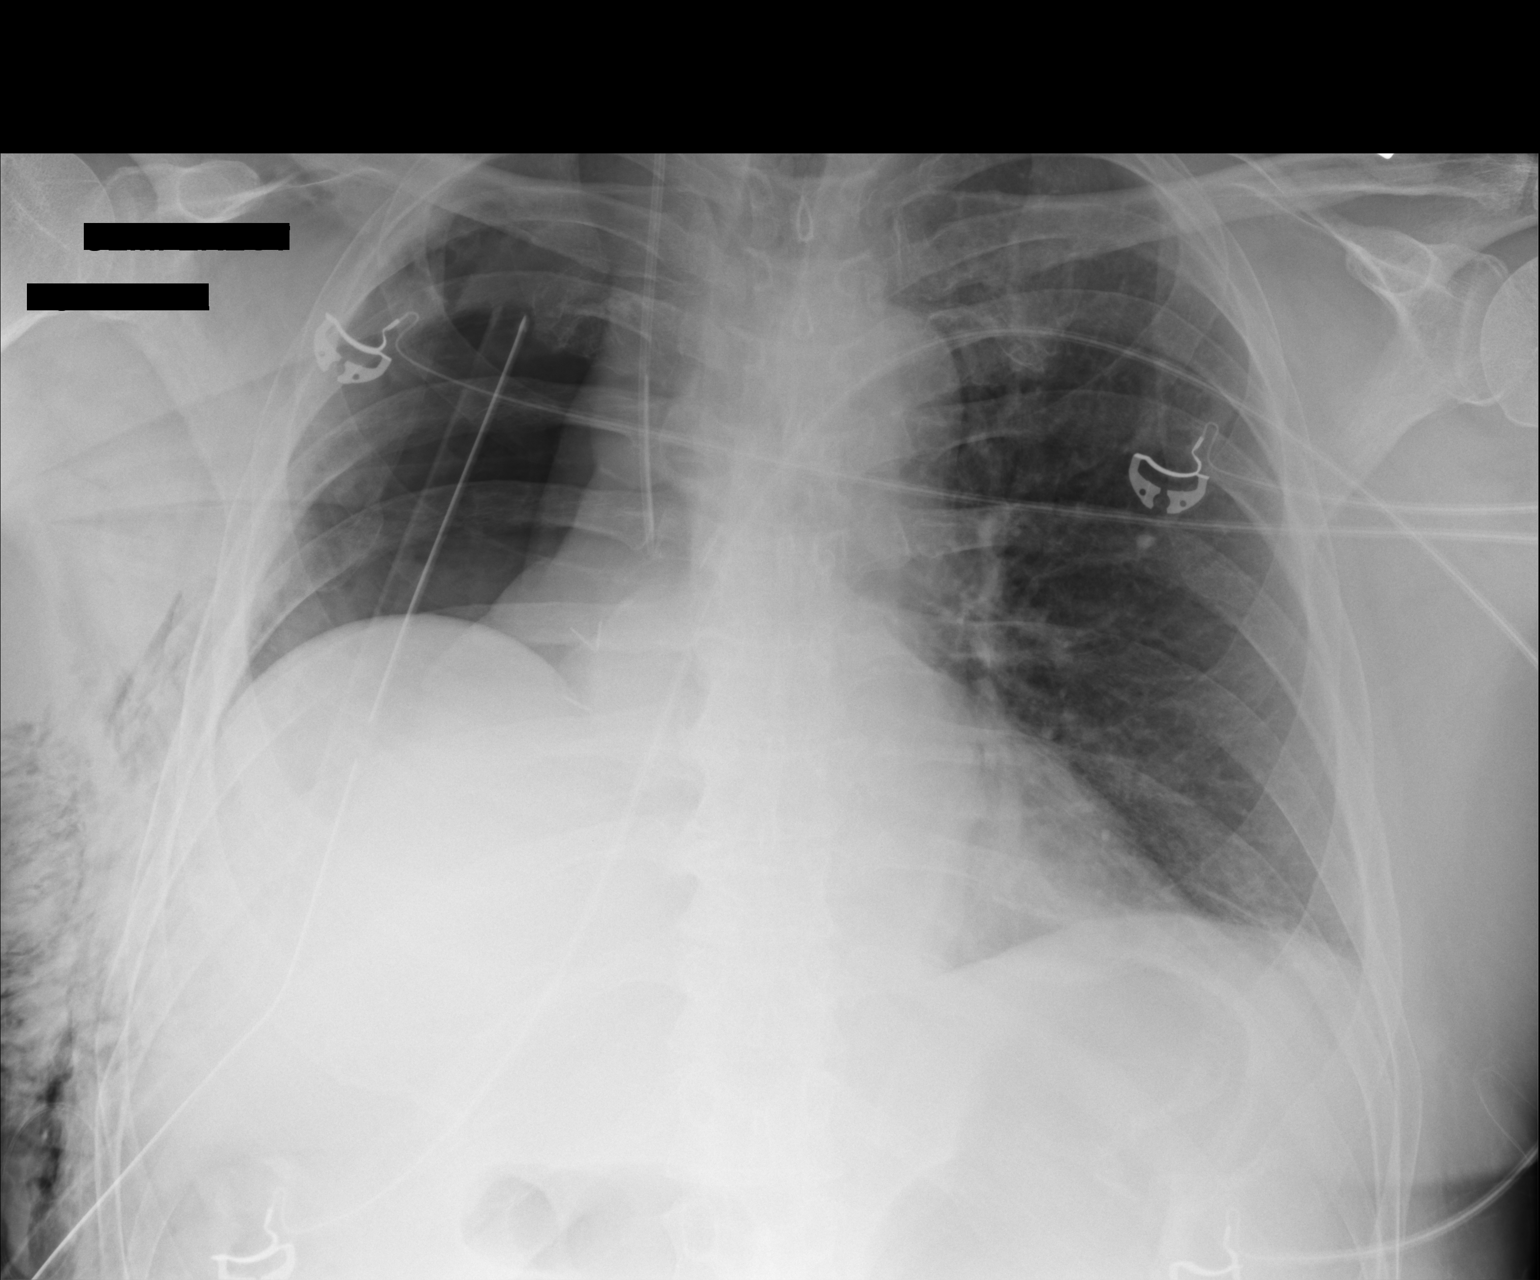

[1 of 1 positions shown; findings below may reference images not displayed]

FINDINGS: There is persistent hyperlucency on the right consistent with
pleural space air following the pneumonectomy. The right-sided chest
tube is unchanged in position with its tip overlying the posterior
aspect of the fifth rib. There is a moderate amount of subcutaneous
emphysema present. The left lung is well-expanded. The interstitial
markings have improved on the left. The cardiac silhouette remains
enlarged. The pulmonary vascularity is normal. The right internal
jugular venous catheter tip projects over the midportion of the SVC.
IMPRESSION: Improved appearance of the left lung with decreased interstitial
edema. Stable post pneumonectomy changes on the right with a large
amount of pleural space air. The support tubes are in reasonable
position.

## 2016-09-27 IMAGING — CR DG CHEST 1V PORT
1 series · 1 of 1 positions shown · non-contrast
Comparison: 07/07/2015

CLINICAL DATA: Post right pneumonectomy.

EXAM:
PORTABLE CHEST 1 VIEW

[AP]
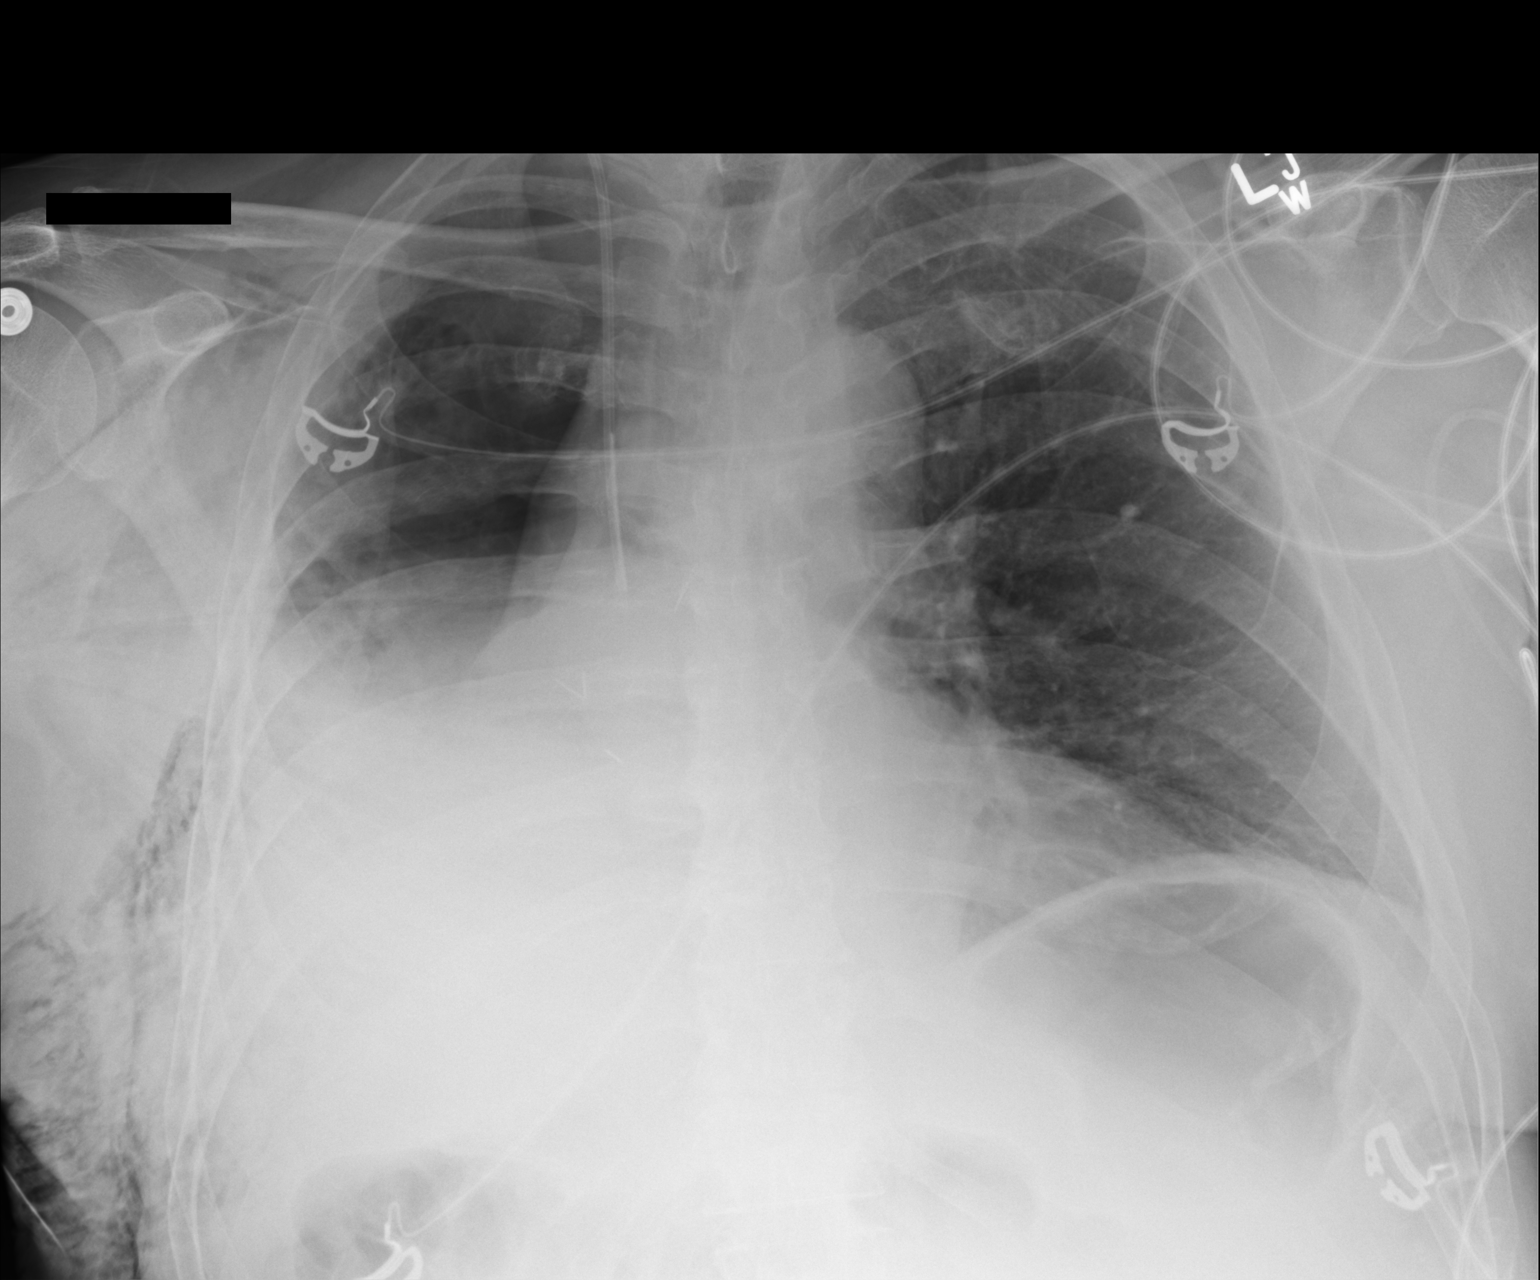

[1 of 1 positions shown; findings below may reference images not displayed]

FINDINGS: Again noted is subcutaneous gas throughout the right chest. Right
chest tube was removed. There continues to be air throughout the
right hemithorax but there are new densities at the right chest
base. Left lung remains clear. Heart and mediastinum are stable.
Right jugular central line tip in the lower SVC region and stable.
IMPRESSION: Removal of the right chest tube with increased densities in the
right lower chest. This could represent some accumulation of fluid.

Left lung remains clear.
